# Patient Record
Sex: Female | Born: 1960 | Race: White | Hispanic: No | Marital: Married | Smoking: Former smoker
Health system: Southern US, Community
[De-identification: ages and names within clinical notes are randomized; demographics above are authoritative.]

## PROBLEM LIST (undated history)

## (undated) DIAGNOSIS — R87619 Unspecified abnormal cytological findings in specimens from cervix uteri: Secondary | ICD-10-CM

## (undated) DIAGNOSIS — E079 Disorder of thyroid, unspecified: Secondary | ICD-10-CM

## (undated) DIAGNOSIS — K635 Polyp of colon: Secondary | ICD-10-CM

## (undated) DIAGNOSIS — I872 Venous insufficiency (chronic) (peripheral): Secondary | ICD-10-CM

## (undated) HISTORY — DX: Disorder of thyroid, unspecified: E07.9

## (undated) HISTORY — PX: TOTAL ABDOMINAL HYSTERECTOMY: SHX209

## (undated) HISTORY — DX: Polyp of colon: K63.5

## (undated) HISTORY — PX: ABLATION: SHX5711

## (undated) HISTORY — DX: Unspecified abnormal cytological findings in specimens from cervix uteri: R87.619

## (undated) HISTORY — PX: BREAST EXCISIONAL BIOPSY: SUR124

## (undated) HISTORY — DX: Venous insufficiency (chronic) (peripheral): I87.2

## (undated) HISTORY — PX: ABDOMINAL HYSTERECTOMY: SHX81

---

## 2020-07-29 ENCOUNTER — Telehealth: Payer: Self-pay | Admitting: *Deleted

## 2020-07-29 NOTE — Telephone Encounter (Signed)
Returned call at 1:27 PM from 11:20 AM. Had to put patient on hold while she looked up insurance information for in network providers, patient hung up. Called patient back at 1:43 PM, and left a message to call the office to schedule appointment.

## 2020-08-12 ENCOUNTER — Encounter: Payer: Self-pay | Admitting: Medical-Surgical

## 2020-08-12 ENCOUNTER — Ambulatory Visit (INDEPENDENT_AMBULATORY_CARE_PROVIDER_SITE_OTHER): Payer: BC Managed Care – PPO | Admitting: Medical-Surgical

## 2020-08-12 ENCOUNTER — Other Ambulatory Visit: Payer: Self-pay

## 2020-08-12 ENCOUNTER — Ambulatory Visit (INDEPENDENT_AMBULATORY_CARE_PROVIDER_SITE_OTHER): Payer: BC Managed Care – PPO

## 2020-08-12 VITALS — BP 150/83 | HR 76 | Temp 97.9°F | Ht 66.25 in | Wt 274.5 lb

## 2020-08-12 DIAGNOSIS — Z7689 Persons encountering health services in other specified circumstances: Secondary | ICD-10-CM

## 2020-08-12 DIAGNOSIS — M533 Sacrococcygeal disorders, not elsewhere classified: Secondary | ICD-10-CM | POA: Diagnosis not present

## 2020-08-12 DIAGNOSIS — Z23 Encounter for immunization: Secondary | ICD-10-CM

## 2020-08-12 DIAGNOSIS — N951 Menopausal and female climacteric states: Secondary | ICD-10-CM

## 2020-08-12 DIAGNOSIS — Z1159 Encounter for screening for other viral diseases: Secondary | ICD-10-CM

## 2020-08-12 DIAGNOSIS — Z114 Encounter for screening for human immunodeficiency virus [HIV]: Secondary | ICD-10-CM

## 2020-08-12 DIAGNOSIS — Z1329 Encounter for screening for other suspected endocrine disorder: Secondary | ICD-10-CM

## 2020-08-12 DIAGNOSIS — Z0000 Encounter for general adult medical examination without abnormal findings: Secondary | ICD-10-CM

## 2020-08-12 DIAGNOSIS — Z1231 Encounter for screening mammogram for malignant neoplasm of breast: Secondary | ICD-10-CM

## 2020-08-12 MED ORDER — MELOXICAM 15 MG PO TABS: 15.0000 mg | ORAL_TABLET | Freq: Every day | ORAL | 0 refills | Status: DC

## 2020-08-12 NOTE — Progress Notes (Signed)
New Patient Office Visit  Subjective:  Patient ID: Mariah Jones, female    DOB: [birth year]  Age: 60 y.o. MRN: [id number]  CC:  Chief Complaint  Patient presents with  . Establish Care    HPI Mariah Jones presents to establish care.  Recently relocated with her husband from [place].  Was in [place] around 11 months ago and had a fall where she injured her tailbone.  She did have imaging done that did not see any particular fractures but notes the images were underpenetrated.  Has continued to have pain over the sacrum and coccyx that interferes with all of her regular activities.  Has difficulty sitting still, lying down, exercising, walking, etc. has tried multiple conservative measures including massage therapy, Tylenol, ibuprofen, heat, and ice.  History of GU tumors with total abdominal hysterectomy.  For the past 15 years, she has been experiencing postmenopausal symptoms.  Having hot flashes regularly.  She did attempt hormone replacement but due to her history of tumors, this was not an option for her.  She also did some bioidentical hormone replacement visits but this was not helpful.  Has not tried Effexor or Pristiq.  Blood pressure is elevated today but no significant history of hypertension.  Reports having an abnormal thyroid lab result in the past but is not sure which one.  No personal history of diagnosed thyroid disorder.  History reviewed. No pertinent past medical history.  History reviewed. No pertinent surgical history.  History reviewed. No pertinent family history.  Social History   Socioeconomic History  . Marital status: Not on file    Spouse name: Not on file  . Number of children: Not on file  . Years of education: Not on file  . Highest education level: Not on file  Occupational History  . Not on file  Tobacco Use  . Smoking status: Former Smoker    Quit date: 1987    Years since quitting: 35.3  . Smokeless tobacco: Never Used  Substance and  Sexual Activity  . Alcohol use: Not Currently  . Drug use: Never  . Sexual activity: Yes    Partners: Male    Birth control/protection: Surgical, Post-menopausal  Other Topics Concern  . Not on file  Social History Narrative  . Not on file   Social Determinants of Health   Financial Resource Strain: Not on file  Food Insecurity: Not on file  Transportation Needs: Not on file  Physical Activity: Not on file  Stress: Not on file  Social Connections: Not on file  Intimate Partner Violence: Not on file    ROS Review of Systems  Objective:   Today's Vitals: BP (!) 150/83   Pulse 76   Temp 97.9 F (36.6 C)   Ht 5' 6.25" (1.683 m)   Wt 274 lb 8 oz (124.5 kg)   SpO2 95%   BMI 43.97 kg/m   Physical Exam Vitals reviewed.  Constitutional:      General: She is not in acute distress.    Appearance: Normal appearance. She is obese. She is not ill-appearing.  HENT:     Head: Normocephalic and atraumatic.  Cardiovascular:     Rate and Rhythm: Normal rate and regular rhythm.     Pulses: Normal pulses.     Heart sounds: Normal heart sounds. No murmur heard. No friction rub. No gallop.   Pulmonary:     Effort: Pulmonary effort is normal. No respiratory distress.     Breath sounds: Normal breath sounds. No  wheezing.  Skin:    General: Skin is warm and dry.  Neurological:     Mental Status: She is alert and oriented to person, place, and time.  Psychiatric:        Mood and Affect: Mood normal.        Behavior: Behavior normal.        Thought Content: Thought content normal.        Judgment: Judgment normal.    Assessment & Plan:   1. Encounter to establish care Reviewed available information and discussed healthcare concerns with patient.  2. Encounter for screening mammogram for malignant neoplasm of breast Since she is overdue, ordering mammogram today. - MM 3D SCREEN BREAST BILATERAL; Future  3. Need for Tdap vaccination Tdap given in office today. - Tdap  vaccine greater than or equal to 7yo IM  4. Preventative health care Checking lipid panel today. - Lipid panel  5. Screening for endocrine disorder Checking TSH. - TSH  6. Coccyodynia Getting updated x-rays.  Referring to physical therapy for pelvic PT.  Once x-ray results are available, may need to go step further with advanced imaging.  Start meloxicam 15 mg daily.  Advised against using other ibuprofen products while on this medication.  Because she is allergic to many of the controlled pain medications, list of noncontrolled substances provided for her review on things that may help her pain. - DG Sacrum/Coccyx; Future - Ambulatory referral to Physical Therapy  7.  Menopausal symptoms Although she is not a candidate for hormone replacement, there are other options available.  Discussed Effexor and Pristiq.  She would like to investigate these and will let me know if she would like to try 1.  Outpatient Encounter Medications as of 08/12/2020  Medication Sig  . meloxicam (MOBIC) 15 MG tablet Take 1 tablet (15 mg total) by mouth daily.  . SOOLANTRA 1 % CREA Apply topically.   No facility-administered encounter medications on file as of 08/12/2020.   Follow-up: Return in about 4 weeks (around 09/09/2020) for Coccydynia/menopausal symptoms.   Thayer Ohm, DNP, APRN, FNP-BC [facility]

## 2020-08-12 NOTE — Patient Instructions (Addendum)
Effexor or Pristiq  Body pain: can use Amitriptyline, Gabapentin, Lyrica, or Cymbalta  Tdap (Tetanus, Diphtheria, Pertussis) Vaccine: What You Need to Know 1. Why get vaccinated? Tdap vaccine can prevent tetanus, diphtheria, and pertussis. Diphtheria and pertussis spread from person to person. Tetanus enters the body through cuts or wounds.  TETANUS (T) causes painful stiffening of the muscles. Tetanus can lead to serious health problems, including being unable to open the mouth, having trouble swallowing and breathing, or death.  DIPHTHERIA (D) can lead to difficulty breathing, heart failure, paralysis, or death.  PERTUSSIS (aP), also known as "whooping cough," can cause uncontrollable, violent coughing that makes it hard to breathe, eat, or drink. Pertussis can be extremely serious especially in babies and young children, causing pneumonia, convulsions, brain damage, or death. In teens and adults, it can cause weight loss, loss of bladder control, passing out, and rib fractures from severe coughing. 2. Tdap vaccine Tdap is only for children 7 years and older, adolescents, and adults.  Adolescents should receive a single dose of Tdap, preferably at age 49 or 12 years. Pregnant people should get a dose of Tdap during every pregnancy, preferably during the early part of the third trimester, to help protect the newborn from pertussis. Infants are most at risk for severe, life-threatening complications from pertussis. Adults who have never received Tdap should get a dose of Tdap. Also, adults should receive a booster dose of either Tdap or Td (a different vaccine that protects against tetanus and diphtheria but not pertussis) every 10 years, or after 5 years in the case of a severe or dirty wound or burn. Tdap may be given at the same time as other vaccines. 3. Talk with your health care provider Tell your vaccine provider if the person getting the vaccine:  Has had an allergic reaction after a  previous dose of any vaccine that protects against tetanus, diphtheria, or pertussis, or has any severe, life-threatening allergies  Has had a coma, decreased level of consciousness, or prolonged seizures within 7 days after a previous dose of any pertussis vaccine (DTP, DTaP, or Tdap)  Has seizures or another nervous system problem  Has ever had Guillain-Barr Syndrome (also called "GBS")  Has had severe pain or swelling after a previous dose of any vaccine that protects against tetanus or diphtheria In some cases, your health care provider may decide to postpone Tdap vaccination until a future visit. People with minor illnesses, such as a cold, may be vaccinated. People who are moderately or severely ill should usually wait until they recover before getting Tdap vaccine.  Your health care provider can give you more information. 4. Risks of a vaccine reaction  Pain, redness, or swelling where the shot was given, mild fever, headache, feeling tired, and nausea, vomiting, diarrhea, or stomachache sometimes happen after Tdap vaccination. People sometimes faint after medical procedures, including vaccination. Tell your provider if you feel dizzy or have vision changes or ringing in the ears.  As with any medicine, there is a very remote chance of a vaccine causing a severe allergic reaction, other serious injury, or death. 5. What if there is a serious problem? An allergic reaction could occur after the vaccinated person leaves the clinic. If you see signs of a severe allergic reaction (hives, swelling of the face and throat, difficulty breathing, a fast heartbeat, dizziness, or weakness), call 9-1-1 and get the person to the nearest hospital. For other signs that concern you, call your health care provider.  Adverse  reactions should be reported to the Vaccine Adverse Event Reporting System (VAERS). Your health care provider will usually file this report, or you can do it yourself. Visit the VAERS  website at www.vaers.LAgents.no or call 5706019434. VAERS is only for reporting reactions, and VAERS staff members do not give medical advice. 6. The National Vaccine Injury Compensation Program The [employer] Vaccine Injury Compensation Program (VICP) is a federal program that was created to compensate people who may have been injured by certain vaccines. Claims regarding alleged injury or death due to vaccination have a time limit for filing, which may be as short as two years. Visit the VICP website at SpiritualWord.at or call 4062141260 to learn about the program and about filing a claim. 7. How can I learn more?  Ask your health care provider.  Call your local or state health department.  Visit the website of the Food and Drug Administration (FDA) for vaccine package inserts and additional information at FinderList.no.  Contact the Centers for Disease Control and Prevention (CDC): ? Call (514)149-5494 (1-800-CDC-INFO) or ? Visit CDC's website at PicCapture.uy. Vaccine Information Statement Tdap (Tetanus, Diphtheria, Pertussis) Vaccine (12/01/2019) This information is not intended to replace advice given to you by your health care provider. Make sure you discuss any questions you have with your health care provider. Document Revised: 12/27/2019 Document Reviewed: 12/27/2019 Elsevier Patient Education  2021 [employer].

## 2020-08-13 ENCOUNTER — Encounter: Payer: Self-pay | Admitting: Medical-Surgical

## 2020-08-13 LAB — LIPID PANEL
Cholesterol: 206 mg/dL — ABNORMAL HIGH (ref <200)
HDL: 42 mg/dL — ABNORMAL LOW (ref >=50)
LDL Cholesterol (Calc): 127 mg/dL (calc) — ABNORMAL HIGH
Non-HDL Cholesterol (Calc): 164 mg/dL (calc) — ABNORMAL HIGH (ref <130)
Total CHOL/HDL Ratio: 4.9 (calc) (ref <5.0)
Triglycerides: 231 mg/dL — ABNORMAL HIGH (ref <150)

## 2020-08-13 LAB — TSH: TSH: 2.31 mIU/L (ref 0.40–4.50)

## 2020-08-14 LAB — HM COLONOSCOPY

## 2020-08-19 ENCOUNTER — Ambulatory Visit (INDEPENDENT_AMBULATORY_CARE_PROVIDER_SITE_OTHER): Payer: BC Managed Care – PPO | Admitting: Rehabilitative and Restorative Service Providers"

## 2020-08-19 ENCOUNTER — Other Ambulatory Visit: Payer: Self-pay

## 2020-08-19 ENCOUNTER — Encounter: Payer: Self-pay | Admitting: Rehabilitative and Restorative Service Providers"

## 2020-08-19 DIAGNOSIS — M533 Sacrococcygeal disorders, not elsewhere classified: Secondary | ICD-10-CM

## 2020-08-19 DIAGNOSIS — R29898 Other symptoms and signs involving the musculoskeletal system: Secondary | ICD-10-CM

## 2020-08-19 DIAGNOSIS — R293 Abnormal posture: Secondary | ICD-10-CM

## 2020-08-19 NOTE — Patient Instructions (Addendum)
Trigger Point Dry Needling  . What is Trigger Point Dry Needling (DN)? o DN is a physical therapy technique used to treat muscle pain and dysfunction. Specifically, DN helps deactivate muscle trigger points (muscle knots).  o A thin filiform needle is used to penetrate the skin and stimulate the underlying trigger point. The goal is for a local twitch response (LTR) to occur and for the trigger point to relax. No medication of any kind is injected during the procedure.   . What Does Trigger Point Dry Needling Feel Like?  o The procedure feels different for each individual patient. Some patients report that they do not actually feel the needle enter the skin and overall the process is not painful. Very mild bleeding may occur. However, many patients feel a deep cramping in the muscle in which the needle was inserted. This is the local twitch response.   Marland Kitchen How Will I feel after the treatment? o Soreness is normal, and the onset of soreness may not occur for a few hours. Typically this soreness does not last longer than two days.  o Bruising is uncommon, however; ice can be used to decrease any possible bruising.  o In rare cases feeling tired or nauseous after the treatment is normal. In addition, your symptoms may get worse before they get better, this period will typically not last longer than 24 hours.   . What Can I do After My Treatment? o Increase your hydration by drinking more water for the next 24 hours. o You may place ice or heat on the areas treated that have become sore, however, do not use heat on inflamed or bruised areas. Heat often brings more relief post needling. o You can continue your regular activities, but vigorous activity is not recommended initially after the treatment for 24 hours. o DN is best combined with other physical therapy such as strengthening, stretching, and other therapies.    Stretch for gluteal folds  Myofacial ball release work prone and  standing  Noodle for sitting

## 2020-08-19 NOTE — Therapy (Signed)
[facility] [place] [address] [place], [place], [address] Phone: [phone]   Fax:  [phone]  Physical Therapy Evaluation  Patient Details  Name: Mariah Jones MRN: [id number] Date of Birth: 1960-11-09 Referring Provider (PT): Christen Butter, NP   Encounter Date: 08/19/2020   PT End of Session - 08/19/20 1045    Visit Number 1    Number of Visits 12    Date for PT Re-Evaluation 09/30/20    PT Start Time 0930    PT Stop Time 1035    PT Time Calculation (min) 65 min    Activity Tolerance Patient tolerated treatment well           Past Medical History:  Diagnosis Date  . Abnormal Pap smear of cervix   . Colon polyps   . Thyroid disease   . Venous insufficiency     Past Surgical History:  Procedure Laterality Date  . ABDOMINAL HYSTERECTOMY    . ABLATION      There were no vitals filed for this visit.    Subjective Assessment - 08/19/20 0935    Subjective Patient reports that she was in [place] 5/21 when she fell 3 times in one day. She landed onback. She has had pain in the sacrum, coccyx, Rt LB since that time. She has tried massage therapy and medications with minimal to no improvement. Pain is "nagging" every day.    Patient Stated Goals get rid of pain - pain free    Currently in Pain? Yes    Pain Score 7     Pain Location Coccyx    Pain Orientation Right    Pain Descriptors / Indicators Nagging;Sharp    Pain Type Chronic pain    Pain Onset More than a month ago    Pain Frequency Constant    Aggravating Factors  sitting straight; yard work; dirving; standing;    Pain Relieving Factors sitting leaning forward - has TENS unit with minimal help when on              [facility] PT Assessment - 08/19/20 0001      Assessment   Medical Diagnosis Coccyx pain; Rt LBP    Referring Provider (PT) Christen Butter, NP    Onset Date/Surgical Date 09/14/19    Hand Dominance Left    Next MD Visit 09/09/20    Prior  Therapy massage therapy      Precautions   Precautions None      Restrictions   Weight Bearing Restrictions No      Balance Screen   Has the patient fallen in the past 6 months No    Has the patient had a decrease in activity level because of a fear of falling?  No    Is the patient reluctant to leave their home because of a fear of falling?  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    Leisure household chores; gardening      Observation/Other Assessments   Focus on Therapeutic Outcomes (FOTO)  44      Posture/Postural Control   Posture Comments wt shifted to the Lt Rt hemipelvis slightly higher than Lt      AROM   Lumbar Flexion  70% pulling Rt LB    Lumbar Extension 60%    Lumbar - Right Side Bend 70%    Lumbar - Left Side Bend 70% puling Rt LB    Lumbar - Right Rotation 50% discomfort Rt LB    Lumbar - Left Rotation 60%      Flexibility   Hamstrings tight Rt > Lt    Quadriceps tight Rt > Lt    ITB tight Rt > Lt    Piriformis tight Rt      Palpation   Spinal mobility painful with hypomobile lower lumbar and sacral spine    SI assessment  abnormal alignment    Palpation comment muscular tightness lateral sacral border Rt > Lt into gluteal fold to coccyx; Rt iliopsoas; gluts; piriformis      Special Tests   Other special tests (-) SLR; slump test                      Objective measurements completed on examination: See above findings.       [facility] Adult PT Treatment/Exercise - 08/19/20 0001      Self-Care   Other Self-Care Comments  education re-sitting posture and alignment      Therapeutic Activites    Therapeutic Activities Other Therapeutic Activities    Other Therapeutic Activities myofacial ball release  work prone working through iliopsoas; standing for posterior hip/buttock Rt side      Lumbar Exercises: Stretches   Hip Flexor Stretch Right;2 reps;20 seconds    Hip Flexor Stretch Limitations standing hips back against counter      Lumbar Exercises: Prone   Other Prone Lumbar Exercises stretch for gluteal folds 10 sec hold x 3 to 4 reps      Moist Heat Therapy   Number Minutes Moist Heat 10 Minutes    Moist Heat Location Lumbar Spine      Cryotherapy   Number Minutes Cryotherapy 10 Minutes    Cryotherapy Location Other (comment)   chest to tolerate heat to Rt hip/LB area   Type of Cryotherapy Ice pack      Manual Therapy   Manual therapy comments skilled palpation to assess response to Dn and manual work    Myofascial Release posterior lumbar to Rt posterior hip/buttock    Other Manual Therapy spread of gluteal folds            Trigger Point Dry Needling - 08/19/20 0001    Consent Given? Yes    Education Handout Provided Yes    Dry Needling Comments Rt    Other Dry Needling DN to Rt lumbar along sacral border    Gluteus Medius Response Palpable increased muscle length    Gluteus Maximus Response Palpable increased muscle length    Piriformis Response Palpable increased muscle length                PT Education - 08/19/20 1020    Education Details HEP POC DN myofacial release    Person(s) Educated Patient    Methods Explanation;Demonstration;Tactile cues;Verbal cues;Handout    Comprehension Verbalized understanding;Returned demonstration;Verbal cues required;Tactile cues required               PT Long Term Goals - 08/19/20 1056      PT LONG TERM GOAL #1   Title Decrease pain to 0/10 to 2/10 allowing patient to sit for > 20 min with upright posture    Time 6    Period Weeks  Status New    Target Date 09/30/20      PT LONG TERM GOAL #2   Title Increase lumbar mobility to 80-85% of normal range with no pain    Time 6    Period Weeks    Status New     Target Date 09/30/20      PT LONG TERM GOAL #3   Title Minimal to no tendetness to palpation through the buttocks into the coccyx    Time 6    Period Weeks    Status New    Target Date 09/30/20      PT LONG TERM GOAL #4   Title Independent in HEP    Time 6    Period Weeks    Status New    Target Date 09/30/20      PT LONG TERM GOAL #5   Title Improve functional limitatioin score to 59    Time 6    Period Weeks    Status New                  Plan - 08/19/20 1049    Clinical Impression Statement Patient presents with a hispory of coccyx and Rt LB/hip pain for the past 11 months following a fall 5/21 in which she landed on her back with brief LOC. Patient has had persistent pain in the coccyx and Rt LB with no significant change with medication and massage therapy. She has limited ROM; abnormal posture and alignment; muscular tightness; pain with palpation through gluteal fold to coccyx; limited functional abilities due to pain. Patient will benefit from PT to address problems identified.    Stability/Clinical Decision Making Stable/Uncomplicated    Clinical Decision Making Low    Rehab Potential Good    PT Frequency 2x / week    PT Duration 6 weeks    PT Treatment/Interventions ADLs/Self Care Home Management;Aquatic Therapy;Cryotherapy;Electrical Stimulation;Iontophoresis 4mg /ml Dexamethasone;Moist Heat;Ultrasound;Functional mobility training;Therapeutic activities;Therapeutic exercise;Balance training;Neuromuscular re-education;Patient/family education;Manual techniques;Dry needling    PT Next Visit Plan assess response to initial visit including DN; continue with DN as tolerated; manual work; stretching; strengthening; body mechanics/posture and alignment education; modalities as indicated(pt has TENS unit at home)    Consulted and Agree with Plan of Care Patient           Patient will benefit from skilled therapeutic intervention in order to improve the following  deficits and impairments:  Increased fascial restricitons,Pain,Decreased activity tolerance,Hypomobility,Impaired flexibility,Improper body mechanics,Other (comment),Decreased mobility,Postural dysfunction  Visit Diagnosis: Coccyxdynia  Other symptoms and signs involving the musculoskeletal system  Abnormal posture     Problem List There are no problems to display for this patient.   Horton Ellithorpe PT, MPH 08/19/2020, 11:44 AM  [facility] [address] [place], [place], [place] Phone: [phone]   Fax:  [phone]  Name: Mariah Jones MRN: [name] Date of Birth: 10-25-1960

## 2020-08-21 ENCOUNTER — Other Ambulatory Visit: Payer: Self-pay

## 2020-08-21 ENCOUNTER — Ambulatory Visit (INDEPENDENT_AMBULATORY_CARE_PROVIDER_SITE_OTHER): Payer: BC Managed Care – PPO

## 2020-08-21 DIAGNOSIS — Z1231 Encounter for screening mammogram for malignant neoplasm of breast: Secondary | ICD-10-CM

## 2020-08-22 ENCOUNTER — Encounter: Payer: Self-pay | Admitting: Rehabilitative and Restorative Service Providers"

## 2020-08-22 ENCOUNTER — Ambulatory Visit (INDEPENDENT_AMBULATORY_CARE_PROVIDER_SITE_OTHER): Payer: BC Managed Care – PPO | Admitting: Rehabilitative and Restorative Service Providers"

## 2020-08-22 DIAGNOSIS — M533 Sacrococcygeal disorders, not elsewhere classified: Secondary | ICD-10-CM

## 2020-08-22 DIAGNOSIS — R29898 Other symptoms and signs involving the musculoskeletal system: Secondary | ICD-10-CM | POA: Diagnosis not present

## 2020-08-22 DIAGNOSIS — R293 Abnormal posture: Secondary | ICD-10-CM

## 2020-08-22 NOTE — Patient Instructions (Signed)
Access Code: [email or web address]: [email or web address]: 04/28/2022Prepared by: Shalamar Crays HoltExercises  Supine Piriformis Stretch with Leg Straight - 2 x daily - 7 x weekly - 1 sets - 3 reps - 30 sec hold  Supine Transversus Abdominis Bracing with Pelvic Floor Contraction - 2 x daily - 7 x weekly - 1 sets - 10 reps - 10sec hold  Sit to Stand - 2 x daily - 7 x weekly - 1 sets - 10 reps - 3-5 sec hold Patient Education  Hospital doctor

## 2020-08-22 NOTE — Therapy (Signed)
[facility] [place] [address] [place], [place], [address] Phone: [phone]   Fax:  [phone]  Physical Therapy Treatment  Patient Details  Name: Mariah Jones MRN: [id number] Date of Birth: Aug 09, 1960 Referring Provider (PT): Christen Butter, NP   Encounter Date: 08/22/2020   PT End of Session - 08/22/20 1615    Visit Number 2    Number of Visits 12    Date for PT Re-Evaluation 09/30/20    PT Start Time 1614    PT Stop Time 1705   MH end of treatment   PT Time Calculation (min) 51 min    Activity Tolerance Patient tolerated treatment well           Past Medical History:  Diagnosis Date  . Abnormal Pap smear of cervix   . Colon polyps   . Thyroid disease   . Venous insufficiency     Past Surgical History:  Procedure Laterality Date  . ABDOMINAL HYSTERECTOMY    . ABLATION    . BREAST EXCISIONAL BIOPSY Left    Age 60    There were no vitals filed for this visit.   Subjective Assessment - 08/22/20 1618    Subjective Felt much better following the first treatment. Has a ball and is working with the self massage. Encouraged by initial progress.    Currently in Pain? Yes    Pain Score 4     Pain Location Coccyx    Pain Orientation Right    Pain Descriptors / Indicators Nagging              [facility] PT Assessment - 08/22/20 0001      Assessment   Medical Diagnosis Coccyx pain; Rt LBP    Referring Provider (PT) Christen Butter, NP    Onset Date/Surgical Date 09/14/19    Hand Dominance Left    Next MD Visit 09/09/20    Prior Therapy massage therapy      Posture/Postural Control   Posture Comments improved standing and sitting posture with decresaed wt shift to the Lt more equal alignment/wt bearing      Palpation   Palpation comment decreased muscular tightness lateral sacral border Rt > Lt into gluteal fold to coccyx; Rt iliopsoas; gluts; piriformis                         [facility] Adult PT  Treatment/Exercise - 08/22/20 0001      Self-Care   Other Self-Care Comments  education re-back care and body mechanics      Therapeutic Activites    Therapeutic Activities Other Therapeutic Activities    Other Therapeutic Activities myofacial ball release work prone working through iliopsoas; standing for posterior hip/buttock Rt side; trial of sitting on soft ball 1-2 min      Lumbar Exercises: Stretches   Hip Flexor Stretch Right;2 reps;20 seconds    Hip Flexor Stretch Limitations standing hips back against counter    Piriformis Stretch Right;3 reps;30 seconds   supine travell with strap     Lumbar Exercises: Seated   Sit to Stand 10 reps   verbal cues to hinge hips and engage core     Lumbar Exercises: Supine   Other Supine Lumbar Exercises 3 and 4 part core 10 sec hold x 10 reps      Lumbar Exercises: Prone   Other Prone Lumbar Exercises stretch for gluteal folds 10 sec hold x 3 to 4 reps  Moist Heat Therapy   Number Minutes Moist Heat 10 Minutes    Moist Heat Location Lumbar Spine      Manual Therapy   Manual therapy comments skilled palpation to assess response to Dn and manual work    Soft tissue mobilization deep tissue work to pt tolerance through the Rt posterior hip and gluteal fold toward coccyx    Myofascial Release posterior lumbar to Rt posterior hip/buttock    Other Manual Therapy spread of gluteal folds            Trigger Point Dry Needling - 08/22/20 0001    Consent Given? Yes    Education Handout Provided Previously provided    Dry Needling Comments Rt    Gluteus Medius Response Palpable increased muscle length    Gluteus Maximus Response Palpable increased muscle length    Piriformis Response Palpable increased muscle length                PT Education - 08/22/20 1649    Education Details HEP back care    Person(s) Educated Patient    Methods Explanation;Demonstration;Tactile cues;Verbal cues;Handout    Comprehension Verbalized  understanding;Returned demonstration;Verbal cues required;Tactile cues required               PT Long Term Goals - 08/19/20 1056      PT LONG TERM GOAL #1   Title Decrease pain to 0/10 to 2/10 allowing patient to sit for > 20 min with upright posture    Time 6    Period Weeks    Status New    Target Date 09/30/20      PT LONG TERM GOAL #2   Title Increase lumbar mobility to 80-85% of normal range with no pain    Time 6    Period Weeks    Status New    Target Date 09/30/20      PT LONG TERM GOAL #3   Title Minimal to no tendetness to palpation through the buttocks into the coccyx    Time 6    Period Weeks    Status New    Target Date 09/30/20      PT LONG TERM GOAL #4   Title Independent in HEP    Time 6    Period Weeks    Status New    Target Date 09/30/20      PT LONG TERM GOAL #5   Title Improve functional limitatioin score to 59    Time 6    Period Weeks    Status New                 Plan - 08/22/20 1638    Clinical Impression Statement Good response to initial treatment and HEP. Patient reports less pain. She is working on her posture and alignment as well as ball relesae work and exercises. Added exercises for home. Continued with DN and manual work with patient more tolerant of manual work, less sensative. Note improved movement patterns and gait pattern. Excellent progress.    Rehab Potential Good    PT Frequency 2x / week    PT Duration 6 weeks    PT Treatment/Interventions ADLs/Self Care Home Management;Aquatic Therapy;Cryotherapy;Electrical Stimulation;Iontophoresis 4mg /ml Dexamethasone;Moist Heat;Ultrasound;Functional mobility training;Therapeutic activities;Therapeutic exercise;Balance training;Neuromuscular re-education;Patient/family education;Manual techniques;Dry needling    PT Next Visit Plan continue with DN as tolerated; manual work; stretching; strengthening; body mechanics/posture and alignment education; modalities as indicated(pt has  TENS unit at home)    PT Home Exercise  Plan [email or web address]    Consulted and Agree with Plan of Care Patient           Patient will benefit from skilled therapeutic intervention in order to improve the following deficits and impairments:     Visit Diagnosis: Coccyxdynia  Other symptoms and signs involving the musculoskeletal system  Abnormal posture     Problem List There are no problems to display for this patient.   Johnothan Bascomb Rober Minion PT, MPH  08/22/2020, 5:01 PM  [facility] [address] [place], [place], [address] Phone: [phone]   Fax:  [phone]  Name: Mariah Jones MRN: [id number] Date of Birth: 22-Jan-1961

## 2020-09-02 ENCOUNTER — Encounter: Payer: BC Managed Care – PPO | Admitting: Rehabilitative and Restorative Service Providers"

## 2020-09-05 ENCOUNTER — Encounter: Payer: BC Managed Care – PPO | Admitting: Rehabilitative and Restorative Service Providers"

## 2020-09-08 ENCOUNTER — Other Ambulatory Visit: Payer: Self-pay | Admitting: Medical-Surgical

## 2020-09-09 ENCOUNTER — Telehealth: Payer: Self-pay | Admitting: Medical-Surgical

## 2020-09-09 ENCOUNTER — Ambulatory Visit: Payer: BC Managed Care – PPO | Admitting: Medical-Surgical

## 2020-09-09 DIAGNOSIS — Z5329 Procedure and treatment not carried out because of patient's decision for other reasons: Secondary | ICD-10-CM

## 2020-09-09 NOTE — Telephone Encounter (Signed)
Thank you for letting me know. Please have her reschedule her appointment.  Thanks, Ander Slade

## 2020-09-09 NOTE — Telephone Encounter (Signed)
Pt called. She thought her appt was at 2:20.  Thank you.

## 2020-09-09 NOTE — Progress Notes (Signed)
   Complete physical exam  Patient: Mariah Jones   DOB: 02/14/1999   60 y.o. Female  MRN: [id number]  Subjective:    No chief complaint on file.   Mariah Jones is a 60 y.o. female who presents today for a complete physical exam. She reports consuming a {diet types:17450} diet. {types:19826} She generally feels {DESC; WELL/FAIRLY WELL/POORLY:18703}. She reports sleeping {DESC; WELL/FAIRLY WELL/POORLY:18703}. She {does/does not:200015} have additional problems to discuss today.    Most recent fall risk assessment:    10/22/2021   10:42 AM  Fall Risk   Falls in the past year? 0  Number falls in past yr: 0  Injury with Fall? 0  Risk for fall due to : No Fall Risks  Follow up Falls evaluation completed     Most recent depression screenings:    10/22/2021   10:42 AM 09/12/2020   10:46 AM  PHQ 2/9 Scores  PHQ - 2 Score 0 0  PHQ- 9 Score 5     {VISON DENTAL STD PSA (Optional):27386}  {History (Optional):23778}  Patient Care Team: Letia Guidry, NP as PCP - General (Nurse Practitioner)   Outpatient Medications Prior to Visit  Medication Sig   fluticasone (FLONASE) 50 MCG/ACT nasal spray Place 2 sprays into both nostrils in the morning and at bedtime. After 7 days, reduce to once daily.   norgestimate-ethinyl estradiol (SPRINTEC 28) 0.25-35 MG-MCG tablet Take 1 tablet by mouth daily.   Nystatin POWD Apply liberally to affected area 2 times per day   spironolactone (ALDACTONE) 100 MG tablet Take 1 tablet (100 mg total) by mouth daily.   No facility-administered medications prior to visit.    ROS        Objective:     There were no vitals taken for this visit. {Vitals History (Optional):23777}  Physical Exam   No results found for any visits on 11/27/21. {Show previous labs (optional):23779}    Assessment & Plan:    Routine Health Maintenance and Physical Exam  Immunization History  Administered Date(s) Administered   DTaP 04/30/1999, 06/26/1999,  09/04/1999, 05/20/2000, 12/04/2003   Hepatitis A 09/30/2007, 10/05/2008   Hepatitis B 02/15/1999, 03/25/1999, 09/04/1999   HiB (PRP-OMP) 04/30/1999, 06/26/1999, 09/04/1999, 05/20/2000   IPV 04/30/1999, 06/26/1999, 02/23/2000, 12/04/2003   Influenza,inj,Quad PF,6+ Mos 01/05/2014   Influenza-Unspecified 04/06/2012   MMR 02/22/2001, 12/04/2003   Meningococcal Polysaccharide 10/05/2011   Pneumococcal Conjugate-13 05/20/2000   Pneumococcal-Unspecified 09/04/1999, 11/18/1999   Tdap 10/05/2011   Varicella 02/23/2000, 09/30/2007    Health Maintenance  Topic Date Due   HIV Screening  Never done   Hepatitis C Screening  Never done   INFLUENZA VACCINE  11/25/2021   PAP-Cervical Cytology Screening  11/27/2021 (Originally 02/14/2020)   PAP SMEAR-Modifier  11/27/2021 (Originally 02/14/2020)   TETANUS/TDAP  11/27/2021 (Originally 10/04/2021)   HPV VACCINES  Discontinued   COVID-19 Vaccine  Discontinued    Discussed health benefits of physical activity, and encouraged her to engage in regular exercise appropriate for her age and condition.  Problem List Items Addressed This Visit   None Visit Diagnoses     Annual physical exam    -  Primary   Cervical cancer screening       Need for Tdap vaccination          No follow-ups on file.     Corrion Stirewalt, NP   

## 2020-09-12 ENCOUNTER — Encounter: Payer: Self-pay | Admitting: Rehabilitative and Restorative Service Providers"

## 2020-09-12 ENCOUNTER — Ambulatory Visit (INDEPENDENT_AMBULATORY_CARE_PROVIDER_SITE_OTHER): Payer: BC Managed Care – PPO | Admitting: Rehabilitative and Restorative Service Providers"

## 2020-09-12 ENCOUNTER — Other Ambulatory Visit: Payer: Self-pay

## 2020-09-12 DIAGNOSIS — R293 Abnormal posture: Secondary | ICD-10-CM

## 2020-09-12 DIAGNOSIS — M533 Sacrococcygeal disorders, not elsewhere classified: Secondary | ICD-10-CM | POA: Diagnosis not present

## 2020-09-12 DIAGNOSIS — R29898 Other symptoms and signs involving the musculoskeletal system: Secondary | ICD-10-CM

## 2020-09-12 NOTE — Patient Instructions (Signed)
Access Code: [email or web address]: [email or web address]: 05/19/2022Prepared by: Majid Mccravy HoltExercises  Supine Piriformis Stretch with Leg Straight - 2 x daily - 7 x weekly - 1 sets - 3 reps - 30 sec hold  Supine Transversus Abdominis Bracing with Pelvic Floor Contraction - 2 x daily - 7 x weekly - 1 sets - 10 reps - 10sec hold  Sit to Stand - 2 x daily - 7 x weekly - 1 sets - 10 reps - 3-5 sec hold  Cat-Camel - 2 x daily - 7 x weekly - 1 sets - 5-10 reps - 3-5 sec hold  Cat-Camel to Child's Pose - 2 x daily - 7 x weekly - 1 sets - 3-5 reps - 20-30 sec hold  Supine Piriformis Stretch - 2 x daily - 7 x weekly - 1 sets - 3 reps - 30 sec hold  Hooklying Isometric Clamshell - 2 x daily - 7 x weekly - 1 sets - 10 reps - 3 sec hold

## 2020-09-12 NOTE — Therapy (Signed)
[facility] [place] [address] [place], [place], [address] Phone: [phone]   Fax:  [phone]  Physical Therapy Treatment  Patient Details  Name: Mariah Jones MRN: [id number] Date of Birth: 29-Jul-1960 Referring Provider (PT): Christen Butter, NP   Encounter Date: 09/12/2020   PT End of Session - 09/12/20 0801    Visit Number 3    Number of Visits 12    Date for PT Re-Evaluation 09/30/20    PT Start Time 0801    PT Stop Time 0849    PT Time Calculation (min) 48 min    Activity Tolerance Patient tolerated treatment well           Past Medical History:  Diagnosis Date  . Abnormal Pap smear of cervix   . Colon polyps   . Thyroid disease   . Venous insufficiency     Past Surgical History:  Procedure Laterality Date  . ABDOMINAL HYSTERECTOMY    . ABLATION    . BREAST EXCISIONAL BIOPSY Left    Age 7    There were no vitals filed for this visit.   Subjective Assessment - 09/12/20 0802    Subjective Patient reports that she loves the ball and can tell a difference when she uses the ball. She has good days and bad days. Still has pain in the Rt posterior hip. She still sometimes "off loads" the Rt hip when sitting.    Currently in Pain? Yes    Pain Score 2     Pain Location Coccyx    Pain Orientation Right    Pain Descriptors / Indicators Nagging    Pain Type Chronic pain    Pain Onset More than a month ago    Pain Frequency Intermittent              [facility] PT Assessment - 09/12/20 0001      Assessment   Medical Diagnosis Coccyx pain; Rt LBP    Referring Provider (PT) Christen Butter, NP    Onset Date/Surgical Date 09/14/19    Hand Dominance Left    Next MD Visit to schedule    Prior Therapy massage therapy      AROM   Lumbar Flexion 75% pulling Rt LB    Lumbar Extension 65%    Lumbar - Right Side Bend 80%    Lumbar - Left Side Bend 80%    Lumbar - Right Rotation 50% discomfort Rt LB    Lumbar - Left  Rotation 60%      Flexibility   Soft Tissue Assessment /Muscle Length --   tight psoas Rt > Lt   Quadriceps tight Rt > Lt    ITB tight Rt > Lt    Piriformis tight Rt > Lt      Palpation   Palpation comment muscular tightness lateral sacral border Rt > Lt into gluteal fold to coccyx; Rt iliopsoas; gluts; piriformis                         [facility] Adult PT Treatment/Exercise - 09/12/20 0001      Lumbar Exercises: Stretches   Piriformis Stretch Right;3 reps;30 seconds   supine travell with strap   Figure 4 Stretch 3 reps;30 seconds   knees to chest     Lumbar Exercises: Seated   Sit to Stand 10 reps   verbal cues to hinge hips and engage core     Lumbar Exercises: Supine  Clam 10 reps;3 seconds   gneen TB - issued blue TB for home   Other Supine Lumbar Exercises 3 and 4 part core 10 sec hold x 10 reps      Lumbar Exercises: Quadruped   Madcat/Old Horse 5 reps    Madcat/Old Horse Limitations childs pose x 5 x 10-15 sec hold      Moist Heat Therapy   Number Minutes Moist Heat 10 Minutes    Moist Heat Location Lumbar Spine      Manual Therapy   Manual therapy comments skilled palpation to assess response to Dn and manual work    Soft tissue mobilization deep tissue work to pt tolerance through the Rt posterior hip and gluteal fold toward coccyx    Myofascial Release posterior lumbar to Rt posterior hip/buttock    Other Manual Therapy spread of gluteal folds            Trigger Point Dry Needling - 09/12/20 0001    Consent Given? Yes    Education Handout Provided Previously provided    Dry Needling Comments Rt/Lt    Electrical Stimulation Performed with Dry Needling Yes    Gluteus Minimus Response Palpable increased muscle length   RT/LT   Gluteus Maximus Response Palpable increased muscle length    Piriformis Response Palpable increased muscle length                PT Education - 09/12/20 0840    Education Details HEP    Person(s) Educated Patient     Methods Explanation;Demonstration;Tactile cues;Verbal cues;Handout    Comprehension Verbalized understanding;Returned demonstration;Verbal cues required;Tactile cues required               PT Long Term Goals - 08/19/20 1056      PT LONG TERM GOAL #1   Title Decrease pain to 0/10 to 2/10 allowing patient to sit for > 20 min with upright posture    Time 6    Period Weeks    Status New    Target Date 09/30/20      PT LONG TERM GOAL #2   Title Increase lumbar mobility to 80-85% of normal range with no pain    Time 6    Period Weeks    Status New    Target Date 09/30/20      PT LONG TERM GOAL #3   Title Minimal to no tendetness to palpation through the buttocks into the coccyx    Time 6    Period Weeks    Status New    Target Date 09/30/20      PT LONG TERM GOAL #4   Title Independent in HEP    Time 6    Period Weeks    Status New    Target Date 09/30/20      PT LONG TERM GOAL #5   Title Improve functional limitatioin score to 59    Time 6    Period Weeks    Status New                 Plan - 09/12/20 1700    Clinical Impression Statement Patient reports some improvement but continued pain in the Rt posterior hip and along crest of the pelvis. She demonstrates some improvement in trunk mobility/ROM and has less palpable tightness in the posterior hip to coccyx. She is stretching only one time a day at most. Encouraged pt to increase exercises to at least two times a day and avoid externally  rotated positions in LE's. Added Cat camel and child's pose. Continued with other exercises. Patient needs to be more consistent with HEP    Rehab Potential Good    PT Frequency 2x / week    PT Duration 6 weeks    PT Treatment/Interventions ADLs/Self Care Home Management;Aquatic Therapy;Cryotherapy;Electrical Stimulation;Iontophoresis 4mg /ml Dexamethasone;Moist Heat;Ultrasound;Functional mobility training;Therapeutic activities;Therapeutic exercise;Balance  training;Neuromuscular re-education;Patient/family education;Manual techniques;Dry needling    PT Next Visit Plan continue with DN as tolerated; manual work; stretching; strengthening; body mechanics/posture and alignment education; modalities as indicated(pt has TENS unit at home)    PT Home Exercise Plan    Consulted and Agree with Plan of Care Patient           Patient will benefit from skilled therapeutic intervention in order to improve the following deficits and impairments:     Visit Diagnosis: Coccyxdynia  Other symptoms and signs involving the musculoskeletal system  Abnormal posture     Problem List There are no problems to display for this patient.   Milan Perkins [id number] PT, MPH  09/12/2020, 8:46 AM  [facility] [address] [place], [place], [place] Phone: [phone]   Fax:  [phone]  Name: Mariah Jones MRN: [name] Date of Birth: 02/04/1961

## 2020-09-12 NOTE — Telephone Encounter (Signed)
You're welcome. She has an appt coming up May 31st.

## 2020-09-16 ENCOUNTER — Ambulatory Visit (INDEPENDENT_AMBULATORY_CARE_PROVIDER_SITE_OTHER): Payer: BC Managed Care – PPO | Admitting: Rehabilitative and Restorative Service Providers"

## 2020-09-16 ENCOUNTER — Encounter: Payer: Self-pay | Admitting: Rehabilitative and Restorative Service Providers"

## 2020-09-16 ENCOUNTER — Other Ambulatory Visit: Payer: Self-pay

## 2020-09-16 DIAGNOSIS — R293 Abnormal posture: Secondary | ICD-10-CM

## 2020-09-16 DIAGNOSIS — M533 Sacrococcygeal disorders, not elsewhere classified: Secondary | ICD-10-CM

## 2020-09-16 DIAGNOSIS — R29898 Other symptoms and signs involving the musculoskeletal system: Secondary | ICD-10-CM | POA: Diagnosis not present

## 2020-09-16 NOTE — Therapy (Signed)
[facility] [place] [address] [place], [place], [address] Phone: [phone]   Fax:  [phone]  Physical Therapy Treatment  Patient Details  Name: Mariah Jones MRN: [id number] Date of Birth: 23-Jul-1960 Referring Provider (PT): Christen Butter, NP   Encounter Date: 09/16/2020   PT End of Session - 09/16/20 0931    Visit Number 4    Number of Visits 12    Date for PT Re-Evaluation 09/30/20    PT Start Time 0930    PT Stop Time 1018    PT Time Calculation (min) 48 min    Activity Tolerance Patient tolerated treatment well           Past Medical History:  Diagnosis Date  . Abnormal Pap smear of cervix   . Colon polyps   . Thyroid disease   . Venous insufficiency     Past Surgical History:  Procedure Laterality Date  . ABDOMINAL HYSTERECTOMY    . ABLATION    . BREAST EXCISIONAL BIOPSY Left    Age 60    There were no vitals filed for this visit.   Subjective Assessment - 09/16/20 0932    Subjective Patient reports that she felt the area we needled last week felt like it was "on fire". She has increased pain in the hip and buttock area that feels like it did when she first came in. She is working again and sitting more and going up and down the steps. She is using the TENS unit at home.    Currently in Pain? Yes    Pain Score 6     Pain Location Coccyx    Pain Orientation Right    Pain Descriptors / Indicators Nagging    Pain Type Chronic pain              [facility] PT Assessment - 09/16/20 0001      Assessment   Medical Diagnosis Coccyx pain; Rt LBP    Referring Provider (PT) Christen Butter, NP    Onset Date/Surgical Date 09/14/19    Hand Dominance Left    Next MD Visit to schedule    Prior Therapy massage therapy      Precautions   Precautions None      Palpation   Palpation comment muscular tightness lateral sacral border Rt > Lt into gluteal fold to coccyx; Rt iliopsoas; gluts; piriformis                          [facility] Adult PT Treatment/Exercise - 09/16/20 0001      Therapeutic Activites    Other Therapeutic Activities sitting on ball btn coccyx and ischial tuberosity; myofacial ball release work prone working through iliopsoas; standing for posterior hip/buttock Rt side      Lumbar Exercises: Seated   Sit to Stand 10 reps   verbal cues to hinge hips and engage core     Lumbar Exercises: Supine   Clam 10 reps;3 seconds   gneen TB - issued blue TB for home   Other Supine Lumbar Exercises 3 and 4 part core 10 sec hold x 10 reps      Lumbar Exercises: Quadruped   Madcat/Old Horse 5 reps    Madcat/Old Horse Limitations childs pose x 3 x 10-15 sec hold    Other Quadruped Lumbar Exercises quadruped hips IR rocking back and forth; side to side; diagonals      Moist Heat Therapy   Number  Minutes Moist Heat 10 Minutes    Moist Heat Location Lumbar Spine      Manual Therapy   Manual therapy comments pt in quadruped amd prone    Soft tissue mobilization deep tissue work to pt tolerance through the Rt posterior hip and gluteal fold toward coccyx    Myofascial Release posterior lumbar to Rt posterior hip/buttock    Other Manual Therapy spread of gluteal folds                       PT Long Term Goals - 08/19/20 1056      PT LONG TERM GOAL #1   Title Decrease pain to 0/10 to 2/10 allowing patient to sit for > 20 min with upright posture    Time 6    Period Weeks    Status New    Target Date 09/30/20      PT LONG TERM GOAL #2   Title Increase lumbar mobility to 80-85% of normal range with no pain    Time 6    Period Weeks    Status New    Target Date 09/30/20      PT LONG TERM GOAL #3   Title Minimal to no tendetness to palpation through the buttocks into the coccyx    Time 6    Period Weeks    Status New    Target Date 09/30/20      PT LONG TERM GOAL #4   Title Independent in HEP    Time 6    Period Weeks    Status New    Target Date  09/30/20      PT LONG TERM GOAL #5   Title Improve functional limitatioin score to 59    Time 6    Period Weeks    Status New                 Plan - 09/16/20 1010    Clinical Impression Statement Patient reports continued "burning' sensation following last visit with DN. Hold DN for now. Continued exercises adding quadruped w/hips Rt rocking. Manual work through the Rt > Lt posterior hips/buttocks into the coccyx area which remains tighter Rt than Lt. Less palpable tightness Rt than previous visits. Gait improving with minimal limp after she is walking for ~ 10-20 feet.    Rehab Potential Good    PT Frequency 2x / week    PT Duration 6 weeks    PT Treatment/Interventions ADLs/Self Care Home Management;Aquatic Therapy;Cryotherapy;Electrical Stimulation;Iontophoresis 4mg /ml Dexamethasone;Moist Heat;Ultrasound;Functional mobility training;Therapeutic activities;Therapeutic exercise;Balance training;Neuromuscular re-education;Patient/family education;Manual techniques;Dry needling    PT Next Visit Plan hold DN; continue with manual work; stretching; strengthening; body mechanics/posture and alignment education; modalities as indicated(pt has TENS unit at home)    PT Home Exercise Plan    Consulted and Agree with Plan of Care Patient           Patient will benefit from skilled therapeutic intervention in order to improve the following deficits and impairments:     Visit Diagnosis: Coccyxdynia  Other symptoms and signs involving the musculoskeletal system  Abnormal posture     Problem List There are no problems to display for this patient.   Petr Bontempo [id number] PT, MPH  09/16/2020, 10:14 AM  [facility] [address] [place], [place], [place] Phone: [phone]   Fax:  [phone]  Name: Mariah Jones MRN: [name] Date of Birth: 07-May-1960

## 2020-09-18 ENCOUNTER — Encounter: Payer: BC Managed Care – PPO | Admitting: Rehabilitative and Restorative Service Providers"

## 2020-09-19 ENCOUNTER — Ambulatory Visit: Payer: BC Managed Care – PPO | Attending: Medical-Surgical | Admitting: Physical Therapy

## 2020-09-19 ENCOUNTER — Other Ambulatory Visit: Payer: Self-pay

## 2020-09-19 ENCOUNTER — Encounter: Payer: Self-pay | Admitting: Physical Therapy

## 2020-09-19 DIAGNOSIS — M6281 Muscle weakness (generalized): Secondary | ICD-10-CM | POA: Diagnosis present

## 2020-09-19 DIAGNOSIS — R29898 Other symptoms and signs involving the musculoskeletal system: Secondary | ICD-10-CM | POA: Diagnosis present

## 2020-09-19 DIAGNOSIS — M62838 Other muscle spasm: Secondary | ICD-10-CM | POA: Insufficient documentation

## 2020-09-19 DIAGNOSIS — R293 Abnormal posture: Secondary | ICD-10-CM | POA: Insufficient documentation

## 2020-09-19 DIAGNOSIS — M533 Sacrococcygeal disorders, not elsewhere classified: Secondary | ICD-10-CM | POA: Diagnosis not present

## 2020-09-19 NOTE — Therapy (Signed)
[facility] [address], [address] [place], [place], [address] Phone: [phone]   Fax:  [phone]  Physical Therapy Treatment  Patient Details  Name: Mariah Jones MRN: [id number] Date of Birth: 21-Mar-1961 Referring Provider (PT): Christen Butter, NP   Encounter Date: 09/19/2020   PT End of Session - 09/19/20 1043    Visit Number 5    Date for PT Re-Evaluation 12/23/20    Authorization Type BCBS    Authorization - Visit Number 5    Authorization - Number of Visits 30    PT Start Time 0930    PT Stop Time 1030    PT Time Calculation (min) 60 min    Activity Tolerance Patient tolerated treatment well;No increased pain    Behavior During Therapy WFL for tasks assessed/performed           Past Medical History:  Diagnosis Date  . Abnormal Pap smear of cervix   . Colon polyps   . Thyroid disease   . Venous insufficiency     Past Surgical History:  Procedure Laterality Date  . ABDOMINAL HYSTERECTOMY    . ABLATION    . BREAST EXCISIONAL BIOPSY Left    Age 60    There were no vitals filed for this visit.   Subjective Assessment - 09/19/20 0933    Subjective Each dry needling session had increased pain. No difficulty with bowel movements. No urinary issues. /ruptured atopic pregnancy and hysterectomy, 15 years ago. Fallen on her buttocks 3 times on the same day. She fell 1 year ago. I see the nurse pratitiioner on Tuesday. Coccyx pain better by 50% from the initial eval.    Patient Stated Goals get rid of pain - pain free    Currently in Pain? Yes    Pain Score 6     Pain Location Coccyx    Pain Orientation Mid;Right    Pain Descriptors / Indicators Sharp;Other (Comment)   deep   Pain Type Chronic pain    Pain Onset More than a month ago    Pain Frequency Intermittent    Aggravating Factors  sitting in seat of car and hard padded chairs, yardwork, sleeping on left side causes throbbing on left leg    Pain  Relieving Factors sitting and leaning to left    Multiple Pain Sites Yes    Pain Score 7    Pain Location Sacrum    Pain Orientation Mid    Pain Descriptors / Indicators Constant    Pain Type Chronic pain    Pain Radiating Towards radiates from the sacrum to the  gluteus medius    Pain Onset More than a month ago    Pain Frequency Constant    Aggravating Factors  standing too long, driving in the car, being in the bed    Pain Relieving Factors uses a ball to massage and hurst so good;              [facility] PT Assessment - 09/19/20 0001      Assessment   Medical Diagnosis Coccyx pain; Rt LBP    Referring Provider (PT) Christen Butter, NP    Onset Date/Surgical Date 09/14/19    Hand Dominance Left    Next MD Visit to schedule    Prior Therapy massage therapy      Precautions   Precautions None      Restrictions   Weight Bearing Restrictions No      Balance Screen  Has the patient fallen in the past 6 months No    Has the patient had a decrease in activity level because of a fear of falling?  No    Is the patient reluctant to leave their home because of a fear of falling?  No      Prior Function   Level of Independence Independent    Vocation Full time employment    Vocation Requirements sitting with a king size feather pillow for extra support    Leisure work, Sales promotion account executive   Overall Cognitive Status Within Functional Limits for tasks assessed      Posture/Postural Control   Posture/Postural Control No significant limitations      ROM / Strength   AROM / PROM / Strength AROM;PROM;Strength      AROM   Right Hip Extension 5    Left Hip Extension 0    Lumbar Flexion decreased by 25% with tightness in the lubar; right PSIS moves upward and higher but both starts out equal    Lumbar Extension decreased by 25%    Lumbar - Right Side Bend no C curve, decreased movement by 50%    Lumbar - Left Side Bend no c curve, decreased by 50%    Lumbar - Right Rotation  decreased by 50%    Lumbar - Left Rotation full      PROM   Overall PROM Comments hip adduction limited bilaterally    Right Hip External Rotation  45    Left Hip External Rotation  70      Strength   Right Hip ABduction 4/5    Right Hip ADduction 4/5    Left Hip ABduction 3+/5    Left Hip ADduction 4/5      Palpation   SI assessment  left AIS is posteriorly rotated; sacrum rotated right    Palpation comment tenderness located in right lower quadrant, thickness in the right gluteal, sacrum very tender      Special Tests    Special Tests Sacrolliac Tests    Other special tests single leg stance very shaky in the pelvis and 1-2 seconds    Sacroiliac Tests  Pelvic Compression      Pelvic Compression   Findings Positive    Side Left    comment pain                         [facility] Adult PT Treatment/Exercise - 09/19/20 0001      Manual Therapy   Manual Therapy Joint mobilization;Soft tissue mobilization;Myofascial release    Joint Mobilization PA and rotational mobilization of T9-L5; sacral mobilization to correct right rotation    Soft tissue mobilization deep soft tissue work to the thoracic and lumbar paraspinals, gluteals, SI joint, quadratus, along the posterior lower rib cage, levator ani    Myofascial Release using suction cups to pull up the fascial from the skin along the gluteals, thoracic and lumbar areas                       PT Long Term Goals - 09/19/20 1054      PT LONG TERM GOAL #1   Title Decrease pain to 0/10 to 2/10 allowing patient to sit for > 20 min with upright posture    Time 6    Period Weeks    Status On-going      PT LONG TERM GOAL #2  Title Increase lumbar mobility to 80-85% of normal range with no pain to perfrom daily tasks    Time 6    Period Weeks    Status Revised      PT LONG TERM GOAL #3   Title Minimal to no tendetness to palpation through the buttocks into the coccyx making it easier for her to sit and  drive and due to pelvis and coccyc  in correct alignment    Time 6    Period Weeks    Status Revised      PT LONG TERM GOAL #4   Title Independent in HEP for core and hip strength to improve function    Time 6    Period Weeks    Status Revised      PT LONG TERM GOAL #5   Title Improve functional limitatioin score to 59    Time 6    Period Weeks    Status On-going                 Plan - 09/19/20 1046    Clinical Impression Statement Patient has  constant pain in the sacral area and gluteus medius at level at level 7/10 and coccyx pain is 6/10. Her lumbar ROM is limited by 50% with very little lumbar spine movement. Her left ilium is rotated posteriorly with positive compression test on the left. The sacrum is rotated to the right with increased tenderness located on the right SI joint. The coccyx is sidebend to the right with increased pain in the right levator ani and coccygeus. Decreased movement of T8-L5 and decreased lower rib cage movement. She has weakness in left hip and decreased left hip extension and bilateral hip abduction. Her left hip external rotation is limited compared to the right hip external rotation. Stand on one leg with incresaed sway of the trunk and pelvis for 1-2 seconds on each leg.  After manual work her pain was not constant and she was able to stand taller. Patient will benefit from skilled therapy to improve tissue mobility and strength to reduce pain and improve quality of life.    Personal Factors and Comorbidities Fitness;Time since onset of injury/illness/exacerbation;Comorbidity 1    Comorbidities abdominal hysterectomy    Examination-Activity Limitations Sit;Locomotion Level;Bed Mobility;Stand    Examination-Participation Restrictions Community Activity;Driving;Occupation    Stability/Clinical Decision Making Stable/Uncomplicated    Clinical Decision Making Low    Rehab Potential Excellent    PT Frequency 1x / week    PT Duration 12 weeks    PT  Treatment/Interventions ADLs/Self Care Home Management;Aquatic Therapy;Cryotherapy;Electrical Stimulation;Iontophoresis 4mg /ml Dexamethasone;Moist Heat;Ultrasound;Functional mobility training;Therapeutic activities;Therapeutic exercise;Balance training;Neuromuscular re-education;Patient/family education;Manual techniques;Dry needling;Spinal Manipulations;Joint Manipulations;Passive range of motion    PT Next Visit Plan manual work to the lumbar and thoracic and gluteal, pelvic tilt, hip strength for abduction, stretch hip extensor and hip adductors    PT Home Exercise Plan    Recommended Other Services sent MD renewal    Consulted and Agree with Plan of Care Patient           Patient will benefit from skilled therapeutic intervention in order to improve the following deficits and impairments:  Increased fascial restricitons,Pain,Decreased activity tolerance,Hypomobility,Impaired flexibility,Improper body mechanics,Decreased mobility,Postural dysfunction,Decreased range of motion,Decreased endurance,Increased muscle spasms,Decreased balance,Decreased strength  Visit Diagnosis: Coccyxdynia - Plan: PT plan of care cert/re-cert  Other symptoms and signs involving the musculoskeletal system - Plan: PT plan of care cert/re-cert  Abnormal posture - Plan: PT plan of care cert/re-cert  Other muscle spasm - Plan: PT plan of care cert/re-cert  Muscle weakness (generalized) - Plan: PT plan of care cert/re-cert     Problem List There are no problems to display for this patient.   Eulis FosterCheryl Yared Susan, PT 09/19/20 10:58 AM   [facility] [address], [address] [place], [place], [address] Phone: [phone]   Fax:  [phone]  Name: Maye HidesBridget Kuiken MRN: [id number] Date of Birth: 20-Oct-1960

## 2020-09-24 ENCOUNTER — Encounter: Payer: Self-pay | Admitting: Medical-Surgical

## 2020-09-24 ENCOUNTER — Other Ambulatory Visit: Payer: Self-pay

## 2020-09-24 ENCOUNTER — Ambulatory Visit (INDEPENDENT_AMBULATORY_CARE_PROVIDER_SITE_OTHER): Payer: BC Managed Care – PPO | Admitting: Medical-Surgical

## 2020-09-24 VITALS — BP 145/82 | HR 74 | Temp 98.0°F | Resp 20 | Ht 66.25 in | Wt 264.0 lb

## 2020-09-24 DIAGNOSIS — G8929 Other chronic pain: Secondary | ICD-10-CM

## 2020-09-24 DIAGNOSIS — M545 Low back pain, unspecified: Secondary | ICD-10-CM | POA: Diagnosis not present

## 2020-09-24 DIAGNOSIS — M533 Sacrococcygeal disorders, not elsewhere classified: Secondary | ICD-10-CM

## 2020-09-24 NOTE — Progress Notes (Signed)
Subjective:    CC: Coccydynia follow-up  HPI: Pleasant 60 year old female presenting today for follow-up on pain in the sacrum/coccyx area.  Since her last visit, she has been to physical therapy and has finally been able to see the pelvic physical therapist.  She has only had 1 appointment with pelvic PT so far.  No significant improvement in her pain.  She did try meloxicam but it did not help so she discontinued the medication.  Taking Aleve/ibuprofen as needed with minimal relief.  I reviewed the past medical history, family history, social history, surgical history, and allergies today and no changes were needed.  Please see the problem list section below in epic for further details.  Past Medical History: Past Medical History:  Diagnosis Date  . Abnormal Pap smear of cervix   . Colon polyps   . Thyroid disease   . Venous insufficiency    Past Surgical History: Past Surgical History:  Procedure Laterality Date  . ABDOMINAL HYSTERECTOMY    . ABLATION    . BREAST EXCISIONAL BIOPSY Left    Age 24   Social History: Social History   Socioeconomic History  . Marital status: Married    Spouse name: Not on file  . Number of children: Not on file  . Years of education: Not on file  . Highest education level: Not on file  Occupational History  . Not on file  Tobacco Use  . Smoking status: Former Smoker    Quit date: 1987    Years since quitting: 35.4  . Smokeless tobacco: Never Used  Vaping Use  . Vaping Use: Never used  Substance and Sexual Activity  . Alcohol use: Not Currently  . Drug use: Never  . Sexual activity: Yes    Partners: Male    Birth control/protection: Surgical, Post-menopausal  Other Topics Concern  . Not on file  Social History Narrative  . Not on file   Social Determinants of Health   Financial Resource Strain: Not on file  Food Insecurity: Not on file  Transportation Needs: Not on file  Physical Activity: Not on file  Stress: Not on file   Social Connections: Not on file   Family History: Family History  Problem Relation Age of Onset  . Hypertension Other   . Heart attack Other   . Diabetes Other   . Stroke Other   . Skin cancer Other   . Colon cancer Other   . Prostate cancer Other   . Cancer Other    Allergies: Allergies  Allergen Reactions  . Meperidine Hcl Itching  . Morphine Anaphylaxis, Itching, Shortness Of Breath and Swelling  . Tramadol Itching  . Acetaminophen Hives and Itching  . Codeine Hives, Itching, Nausea And Vomiting and Swelling  . Sulfamethoxazole-Trimethoprim Hives and Itching   Medications: See med rec.  Review of Systems: See HPI for pertinent positives and negatives.   Objective:    General: Well Developed, well nourished, and in no acute distress.  Neuro: Alert and oriented x3.  HEENT: Normocephalic, atraumatic.  Skin: Warm and dry. Cardiac: Regular rate and rhythm, no murmurs rubs or gallops, no lower extremity edema.  Respiratory: Clear to auscultation bilaterally. Not using accessory muscles, speaking in full sentences.  Impression and Recommendations:    1. Coccyodynia X-rays negative for indication of cause for coccydynia.  Continue pelvic physical therapy as scheduled.  Discussed pharmaceutical management the patient would like to hold off on starting a medication at this time.  We will go  ahead and get an MRI of the sacrum and SI joints without contrast for further evaluation. - MR SACRUM SI JOINTS WO CONTRAST; Future  2. Chronic right-sided low back pain without sciatica Since she does have some radiation to the right side from the low back also getting an MRI of the lumbar spine. - MR Lumbar Spine Wo Contrast; Future  Return if symptoms worsen or fail to improve. ___________________________________________ Thayer Ohm, DNP, APRN, FNP-BC Primary Care and Sports Medicine [facility]

## 2020-09-27 ENCOUNTER — Telehealth: Payer: Self-pay | Admitting: Medical-Surgical

## 2020-09-27 NOTE — Telephone Encounter (Signed)
Peer to peer completed successfully. Both MRI of the lumbar spine and pelvis approved. Approval # [id number], good from 09/26/2020 through 11/24/2020.   Thayer Ohm, DNP, APRN, FNP-BC [facility] and Sports Medicine

## 2020-10-07 ENCOUNTER — Other Ambulatory Visit: Payer: Self-pay | Admitting: Medical-Surgical

## 2020-10-07 MED ORDER — TRIAZOLAM 0.25 MG PO TABS: ORAL_TABLET | ORAL | 0 refills | Status: DC

## 2020-10-07 NOTE — Progress Notes (Deleted)
Virtual Visit via Telephone   I connected with  Mariah Jones  on 10/07/20 by telephone/telehealth and verified that I am speaking with the correct person using two identifiers.   I discussed the limitations, risks, security and privacy concerns of performing an evaluation and management service by telephone, including the higher likelihood of inaccurate diagnosis and treatment, and the availability of in person appointments.  We also discussed the likely need of an additional face to face encounter for complete and high quality delivery of care.  I also discussed with the patient that there may be a patient responsible charge related to this service. The patient expressed understanding and wishes to proceed.  Provider location is in medical facility. Patient location is at their home, different from provider location. People involved in care of the patient during this telehealth encounter were myself, my nurse/medical assistant, and my front office/scheduling team member.  CC:  HPI:   Review of Systems: See HPI for pertinent positives and negatives.   Objective Findings:    General: Speaking full sentences, no audible heavy breathing.  Sounds alert and appropriately interactive.    Impression and Recommendations:      I discussed the above assessment and treatment plan with the patient. The patient was provided an opportunity to ask questions and all were answered. The patient agreed with the plan and demonstrated an understanding of the instructions.   The patient was advised to call back or seek an in-person evaluation if the symptoms worsen or if the condition fails to improve as anticipated.  minutes of non-face-to-face time was provided during this encounter.  No follow-ups on file. ___________________________________________ Christen Butter, DNP, APRN, FNP-BC Primary Care and Sports Medicine [facility]

## 2020-10-11 ENCOUNTER — Encounter: Payer: Self-pay | Admitting: Physical Therapy

## 2020-10-11 ENCOUNTER — Other Ambulatory Visit: Payer: Self-pay

## 2020-10-11 ENCOUNTER — Ambulatory Visit: Payer: BC Managed Care – PPO | Attending: Medical-Surgical | Admitting: Physical Therapy

## 2020-10-11 DIAGNOSIS — R29898 Other symptoms and signs involving the musculoskeletal system: Secondary | ICD-10-CM | POA: Insufficient documentation

## 2020-10-11 DIAGNOSIS — M533 Sacrococcygeal disorders, not elsewhere classified: Secondary | ICD-10-CM | POA: Diagnosis not present

## 2020-10-11 DIAGNOSIS — M6281 Muscle weakness (generalized): Secondary | ICD-10-CM | POA: Diagnosis present

## 2020-10-11 DIAGNOSIS — R293 Abnormal posture: Secondary | ICD-10-CM | POA: Insufficient documentation

## 2020-10-11 DIAGNOSIS — M62838 Other muscle spasm: Secondary | ICD-10-CM | POA: Diagnosis present

## 2020-10-11 NOTE — Therapy (Signed)
[facility] [address] [place], [place], [address] Phone: [phone]   Fax:  [phone]  Physical Therapy Treatment  Patient Details  Name: Mariah Jones MRN: [id number] Date of Birth: 08-Feb-1961 Referring Provider (PT): Christen Butter, NP   Encounter Date: 10/11/2020   PT End of Session - 10/11/20 1024     Visit Number 6    Date for PT Re-Evaluation 12/23/20    Authorization Type BCBS    Authorization - Visit Number 6    Authorization - Number of Visits 30    PT Start Time 0930    PT Stop Time 1015    PT Time Calculation (min) 45 min    Activity Tolerance Patient tolerated treatment well;No increased pain    Behavior During Therapy WFL for tasks assessed/performed             Past Medical History:  Diagnosis Date   Abnormal Pap smear of cervix    Colon polyps    Thyroid disease    Venous insufficiency     Past Surgical History:  Procedure Laterality Date   ABDOMINAL HYSTERECTOMY     ABLATION     BREAST EXCISIONAL BIOPSY Left    Age 28    There were no vitals filed for this visit.   Subjective Assessment - 10/11/20 0935     Subjective I am having a MRI on my back and sacrum. I felt better after therapy but the pain came back the next day. Coccyx has been feeling better. Most pain in the back.    Patient Stated Goals get rid of pain - pain free    Currently in Pain? Yes    Pain Location Coccyx    Pain Orientation Right;Mid    Pain Descriptors / Indicators Sharp;Other (Comment)   deep   Pain Type Chronic pain    Pain Onset More than a month ago    Pain Frequency Intermittent    Aggravating Factors  sitting car seat, hard padded chairs,    Pain Relieving Factors sitting and leaning to left    Multiple Pain Sites Yes    Pain Score 7    Pain Location Sacrum   wraps around the buttokcs   Pain Orientation Mid;Right    Pain Descriptors / Indicators Constant    Pain Type Chronic pain    Pain  Radiating Towards radiates from the sacrum to the gluteus medius, wakes up and not able to stand up straight, unable to lay right side    Pain Onset More than a month ago    Aggravating Factors  wakes up and not able to stand up straight, unable to lay right side; driving, being in the bed    Pain Relieving Factors uses a ball to massage and hurts good                [facility] PT Assessment - 10/11/20 0001       Posture/Postural Control   Posture/Postural Control Postural limitations    Posture Comments right lateral shift , feels better to off load the right leg                           [facility] Adult PT Treatment/Exercise - 10/11/20 0001       Lumbar Exercises: Prone   Other Prone Lumbar Exercises laying prone with belt to shift hips to the left monitoring for centralization,  Modalities   Modalities Iontophoresis      Iontophoresis   Type of Iontophoresis Dexamethasone    Location right SI joint    Dose 1 ml, #1    Time 6 hour patch      Manual Therapy   Manual Therapy Joint mobilization;Soft tissue mobilization;Myofascial release    Joint Mobilization PA and rotational mobilization to right L4-5 and L5-S1 while monitoring for pain and centralization; sacral mobilization to rotate to the left    Soft tissue mobilization soft tissue work to the right gluteal and right lumbar paraspinals    Myofascial Release using suction cup on the lumbar and right gluteal witn increased senstivity on the right gluteus medius    Other Manual Therapy prone moving the hip to the left with a belt to centralize the pain; standing with lateral shift to move the hips to the left whiel trying to centralize the pain                    PT Education - 10/11/20 1023     Education Details information on iontophoresis and to take the patch off at 4 PM    Person(s) Educated Patient    Methods Explanation;Handout    Comprehension Verbalized understanding                  PT Long Term Goals - 09/19/20 1054       PT LONG TERM GOAL #1   Title Decrease pain to 0/10 to 2/10 allowing patient to sit for > 20 min with upright posture    Time 6    Period Weeks    Status On-going      PT LONG TERM GOAL #2   Title Increase lumbar mobility to 80-85% of normal range with no pain to perfrom daily tasks    Time 6    Period Weeks    Status Revised      PT LONG TERM GOAL #3   Title Minimal to no tendetness to palpation through the buttocks into the coccyx making it easier for her to sit and drive and due to pelvis and coccyc  in correct alignment    Time 6    Period Weeks    Status Revised      PT LONG TERM GOAL #4   Title Independent in HEP for core and hip strength to improve function    Time 6    Period Weeks    Status Revised      PT LONG TERM GOAL #5   Title Improve functional limitatioin score to 59    Time 6    Period Weeks    Status On-going                   Plan - 10/11/20 1025     Clinical Impression Statement Patient is having less coccyx pain since last visit. She is having more right sacral to the buttocks pain. Patient is going to have a MRI of her lumbar and sacrum. Patient had decreased mobility of L4-S1 and sacrum is rotated to the right. She has trigger points in the right gluteus medius and right side fo L5. After manual work her pain was reduced. She has pinpoint pain in the right SI joint. One point she had no pain in the area in extension with mobilization to the vertebrae. Patient has decreased weightbear on the right. She feels best in standig wtih dropping the right hip and unweight the  right leg. Patient will benefit from skilled therapy to improve tissue mobility and strength to reduce pain and improve quality of life.    Personal Factors and Comorbidities Fitness;Time since onset of injury/illness/exacerbation;Comorbidity 1    Comorbidities abdominal hysterectomy    Examination-Activity Limitations  Sit;Locomotion Level;Bed Mobility;Stand    Examination-Participation Restrictions Community Activity;Driving;Occupation    Stability/Clinical Decision Making Stable/Uncomplicated    Rehab Potential Excellent    PT Frequency 1x / week    PT Duration 12 weeks    PT Treatment/Interventions ADLs/Self Care Home Management;Aquatic Therapy;Cryotherapy;Electrical Stimulation;Iontophoresis 4mg /ml Dexamethasone;Moist Heat;Ultrasound;Functional mobility training;Therapeutic activities;Therapeutic exercise;Balance training;Neuromuscular re-education;Patient/family education;Manual techniques;Dry needling;Spinal Manipulations;Joint Manipulations;Passive range of motion    PT Next Visit Plan see how the ionto did, check on MRI; manual work to the lumbar and sacrum; hip strength for the gluteus medius, stretch hip extensor and adductors    PT Home Exercise Plan    Recommended Other Services MD signed both initial and renewal    Consulted and Agree with Plan of Care Patient             Patient will benefit from skilled therapeutic intervention in order to improve the following deficits and impairments:  Increased fascial restricitons, Pain, Decreased activity tolerance, Hypomobility, Impaired flexibility, Improper body mechanics, Decreased mobility, Postural dysfunction, Decreased range of motion, Decreased endurance, Increased muscle spasms, Decreased balance, Decreased strength  Visit Diagnosis: Coccyxdynia  Other symptoms and signs involving the musculoskeletal system  Abnormal posture  Other muscle spasm  Muscle weakness (generalized)     Problem List There are no problems to display for this patient.   [email or web address], PT 10/11/20 10:32 AM  [facility] [address], [address] [place], [place], [place] Phone: [phone]   Fax:  [phone]  Name: Meredith Kilbride MRN: [name] Date of Birth: 06-21-1960

## 2020-10-11 NOTE — Patient Instructions (Signed)

## 2020-10-12 ENCOUNTER — Ambulatory Visit (INDEPENDENT_AMBULATORY_CARE_PROVIDER_SITE_OTHER): Payer: BC Managed Care – PPO

## 2020-10-12 DIAGNOSIS — M533 Sacrococcygeal disorders, not elsewhere classified: Secondary | ICD-10-CM

## 2020-10-12 DIAGNOSIS — M545 Low back pain, unspecified: Secondary | ICD-10-CM

## 2020-10-12 DIAGNOSIS — G8929 Other chronic pain: Secondary | ICD-10-CM | POA: Diagnosis not present

## 2020-10-14 ENCOUNTER — Encounter: Payer: Self-pay | Admitting: Physical Therapy

## 2020-10-14 ENCOUNTER — Ambulatory Visit: Payer: BC Managed Care – PPO | Admitting: Physical Therapy

## 2020-10-14 ENCOUNTER — Encounter: Payer: Self-pay | Admitting: Medical-Surgical

## 2020-10-14 ENCOUNTER — Other Ambulatory Visit: Payer: Self-pay

## 2020-10-14 DIAGNOSIS — M6281 Muscle weakness (generalized): Secondary | ICD-10-CM

## 2020-10-14 DIAGNOSIS — M62838 Other muscle spasm: Secondary | ICD-10-CM

## 2020-10-14 DIAGNOSIS — R29898 Other symptoms and signs involving the musculoskeletal system: Secondary | ICD-10-CM

## 2020-10-14 DIAGNOSIS — R293 Abnormal posture: Secondary | ICD-10-CM

## 2020-10-14 DIAGNOSIS — M533 Sacrococcygeal disorders, not elsewhere classified: Secondary | ICD-10-CM | POA: Diagnosis not present

## 2020-10-14 NOTE — Therapy (Signed)
[facility] [address] [place], [place], [address] Phone: [phone]   Fax:  [phone]  Physical Therapy Treatment  Patient Details  Name: Mariah Jones MRN: [id number] Date of Birth: 01-Jun-1960 Referring Provider (PT): Christen Butter, NP   Encounter Date: 10/14/2020   PT End of Session - 10/14/20 1154     Visit Number 7    Number of Visits 0    Date for PT Re-Evaluation 12/23/20    Authorization Type BCBS    Authorization - Visit Number 7    Authorization - Number of Visits 30    PT Start Time 0930    PT Stop Time 1015    PT Time Calculation (min) 45 min    Activity Tolerance Patient tolerated treatment well;No increased pain    Behavior During Therapy WFL for tasks assessed/performed             Past Medical History:  Diagnosis Date   Abnormal Pap smear of cervix    Colon polyps    Thyroid disease    Venous insufficiency     Past Surgical History:  Procedure Laterality Date   ABDOMINAL HYSTERECTOMY     ABLATION     BREAST EXCISIONAL BIOPSY Left    Age 60    There were no vitals filed for this visit.   Subjective Assessment - 10/14/20 0937     Subjective The patch made the pain go away until I took it off.    Patient Stated Goals get rid of pain - pain free    Currently in Pain? Yes    Pain Score 1     Pain Location Coccyx    Pain Orientation Right;Mid    Pain Descriptors / Indicators Sharp;Other (Comment)   deep   Pain Type Chronic pain    Pain Onset More than a month ago    Pain Frequency Intermittent    Aggravating Factors  sitting    Pain Relieving Factors noit sitting    Multiple Pain Sites Yes    Pain Score 7    Pain Location Sacrum   wraps around the buttocks   Pain Orientation Right;Mid    Pain Descriptors / Indicators Constant    Pain Type Chronic pain    Pain Radiating Towards radiates from the sacrum to the gluteus medius, wakes up and not able to stand up straight,  unable to lay right side    Pain Onset More than a month ago    Pain Frequency Constant    Aggravating Factors  wakes up and not able to stand up straight, unable to lay right side, dirving, being in the bed    Pain Relieving Factors uses a ball to massage and hurts good                               [facility] Adult PT Treatment/Exercise - 10/14/20 0001       Modalities   Modalities Traction;Iontophoresis      Iontophoresis   Type of Iontophoresis Dexamethasone    Location right SI joint   checked the skin and it is clear   Dose 1 ml, #2    Time 6 hour patch      Traction   Type of Traction Lumbar    Max (lbs) 70   then increased to 75 then reduced to 55, then left leg was going numb so stopped the traction  Time 8 min                    PT Education - 10/14/20 1154     Education Details take ionto off at 16:00    Person(s) Educated Patient    Comprehension Verbalized understanding                 PT Long Term Goals - 10/14/20 1205       PT LONG TERM GOAL #1   Title Decrease pain to 0/10 to 2/10 allowing patient to sit for > 20 min with upright posture    Time 6    Period Weeks    Status On-going      PT LONG TERM GOAL #2   Title Increase lumbar mobility to 80-85% of normal range with no pain to perfrom daily tasks    Time 6    Period Weeks    Status Revised      PT LONG TERM GOAL #3   Title Minimal to no tendetness to palpation through the buttocks into the coccyx making it easier for her to sit and drive and due to pelvis and coccyc  in correct alignment    Time 6    Period Weeks    Status Revised      PT LONG TERM GOAL #4   Title Independent in HEP for core and hip strength to improve function    Time 6    Period Weeks    Status Revised      PT LONG TERM GOAL #5   Title Improve functional limitatioin score to 59    Time 6    Period Weeks    Status On-going                   Plan - 10/14/20 1155      Clinical Impression Statement Patient has had her MRI on the lumbar and sacrum. The ionto helped while on the right sacral area but once it is off then the pain and sensitivity. Patient tried the lumbar traction and had her left leg fall asleep and tingling. Once she was off the traction table the symptoms reduced. Patient coccyx pain is decreased to 1/10 but her sacral pain could be aggravating her coccyx pain. Patient reports she reduced height since the injury. Patient will be on a cruise and will not be able to get in till August. Hopefully she has talked to the doctor for a plan with her lumbar spine.    Personal Factors and Comorbidities Fitness;Time since onset of injury/illness/exacerbation;Comorbidity 1    Comorbidities abdominal hysterectomy    Examination-Activity Limitations Sit;Locomotion Level;Bed Mobility;Stand    Examination-Participation Restrictions Community Activity;Driving;Occupation    Stability/Clinical Decision Making Stable/Uncomplicated    Rehab Potential Excellent    PT Frequency 1x / week    PT Duration 12 weeks    PT Treatment/Interventions ADLs/Self Care Home Management;Aquatic Therapy;Cryotherapy;Electrical Stimulation;Iontophoresis 4mg /ml Dexamethasone;Moist Heat;Ultrasound;Functional mobility training;Therapeutic activities;Therapeutic exercise;Balance training;Neuromuscular re-education;Patient/family education;Manual techniques;Dry needling;Spinal Manipulations;Joint Manipulations;Passive range of motion    PT Next Visit Plan see if she has talked tto the doctor about her lumbar spine; hip strength for the gluteus medius, coccyx release, stretch the hip extensor and adductors    PT Home Exercise Plan    Consulted and Agree with Plan of Care Patient             Patient will benefit from skilled therapeutic intervention in order to improve the following deficits  and impairments:  Increased fascial restricitons, Pain, Decreased activity tolerance,  Hypomobility, Impaired flexibility, Improper body mechanics, Decreased mobility, Postural dysfunction, Decreased range of motion, Decreased endurance, Increased muscle spasms, Decreased balance, Decreased strength  Visit Diagnosis: Coccyxdynia  Other symptoms and signs involving the musculoskeletal system  Abnormal posture  Other muscle spasm  Muscle weakness (generalized)     Problem List There are no problems to display for this patient.   Eulis Foster, PT 10/14/20 12:07 PM  [facility] [address], [address] [place], [place], [address] Phone: [phone]   Fax:  [phone]  Name: Mariah Jones MRN: [id number] Date of Birth: 1960/06/28

## 2020-11-28 ENCOUNTER — Ambulatory Visit: Payer: BC Managed Care – PPO | Attending: Medical-Surgical | Admitting: Physical Therapy

## 2020-11-28 ENCOUNTER — Other Ambulatory Visit: Payer: Self-pay

## 2020-11-28 ENCOUNTER — Encounter: Payer: Self-pay | Admitting: Physical Therapy

## 2020-11-28 DIAGNOSIS — M62838 Other muscle spasm: Secondary | ICD-10-CM

## 2020-11-28 DIAGNOSIS — M6281 Muscle weakness (generalized): Secondary | ICD-10-CM

## 2020-11-28 DIAGNOSIS — R293 Abnormal posture: Secondary | ICD-10-CM

## 2020-11-28 DIAGNOSIS — R29898 Other symptoms and signs involving the musculoskeletal system: Secondary | ICD-10-CM | POA: Diagnosis present

## 2020-11-28 DIAGNOSIS — M533 Sacrococcygeal disorders, not elsewhere classified: Secondary | ICD-10-CM

## 2020-11-28 NOTE — Therapy (Addendum)
[facility] [address] [place], [place], [address] Phone: [phone]   Fax:  [phone]  Physical Therapy Treatment  Patient Details  Name: Mariah Jones MRN: [id number] Date of Birth: [birth year] Referring Provider (PT): Samuel Bouche, NP   Encounter Date: 11/28/2020   PT End of Session - 11/28/20 1310     Visit Number 8    Date for PT Re-Evaluation 12/23/20    Authorization Type BCBS    Authorization - Visit Number 8    Authorization - Number of Visits 30    PT Start Time 0930    PT Stop Time 1010    PT Time Calculation (min) 40 min    Activity Tolerance Patient tolerated treatment well;No increased pain    Behavior During Therapy WFL for tasks assessed/performed             Past Medical History:  Diagnosis Date   Abnormal Pap smear of cervix    Colon polyps    Thyroid disease    Venous insufficiency     Past Surgical History:  Procedure Laterality Date   ABDOMINAL HYSTERECTOMY     ABLATION     BREAST EXCISIONAL BIOPSY Left    Age 60    There were no vitals filed for this visit.   Subjective Assessment - 11/28/20 0939     Subjective The traction did not help. The pain is still there. I have not seen the doctor so have not gone over the MRI results. I am leaving next week for [place].    Patient Stated Goals get rid of pain - pain free    Currently in Pain? Yes    Pain Score 1     Pain Location Coccyx    Pain Orientation Right;Mid    Pain Descriptors / Indicators Sharp    Pain Type Chronic pain    Pain Onset More than a month ago    Pain Frequency Intermittent    Aggravating Factors  sitting    Pain Relieving Factors not siting    Multiple Pain Sites Yes    Pain Score 5    Pain Location Sacrum    Pain Orientation Right;Mid    Pain Descriptors / Indicators Constant    Pain Type Chronic pain    Pain Radiating Towards radistes from the sacrum to the gluteus medius, wakes up and not able  to stand up straight, unable to lay right side    Pain Onset More than a month ago    Pain Frequency Constant    Aggravating Factors  wakes up and not able to stand up straight, unable to lay right side, driving, being in the bed    Pain Relieving Factors uses a ball to massage and hurts good                               [facility] Adult PT Treatment/Exercise - 11/28/20 0001       Manual Therapy   Manual Therapy Myofascial release;Soft tissue mobilization    Soft tissue mobilization soft tissue mobilization to the thoracic area, lumbar area, quadratus, along the lateral trunk sides, the glureals, coccygeus, hip rotators, and along the SI joint in prone    Myofascial Release using suction cups along the thoracic, lumbar and gluteals to release the fascia and saw alot of erythemia due to the releases  PT Long Term Goals - 10/14/20 1205       PT LONG TERM GOAL #1   Title Decrease pain to 0/10 to 2/10 allowing patient to sit for > 20 min with upright posture    Time 6    Period Weeks    Status On-going      PT LONG TERM GOAL #2   Title Increase lumbar mobility to 80-85% of normal range with no pain to perfrom daily tasks    Time 6    Period Weeks    Status Revised      PT LONG TERM GOAL #3   Title Minimal to no tendetness to palpation through the buttocks into the coccyx making it easier for her to sit and drive and due to pelvis and coccyc  in correct alignment    Time 6    Period Weeks    Status Revised      PT LONG TERM GOAL #4   Title Independent in HEP for core and hip strength to improve function    Time 6    Period Weeks    Status Revised      PT LONG TERM GOAL #5   Title Improve functional limitatioin score to 60    Time 6    Period Weeks    Status On-going                   Plan - 11/28/20 1311     Clinical Impression Statement Patient coccyx pain decreased to 1/10 after manual work and her back  pain. Her coccyx pain is 50% better. She has increased pain in the lumbar that will radiate into the right gluteus and left lower extremity. Patient still needs to consult with her doctor to come up with a plan for her back. She is traveling alot making it difficult to see her doctor. Patient has not met her goals yet. She should consult someone about her lumbar spine due to the results of her spine.    Personal Factors and Comorbidities Fitness;Time since onset of injury/illness/exacerbation;Comorbidity 1    Comorbidities abdominal hysterectomy    Examination-Activity Limitations Sit;Locomotion Level;Bed Mobility;Stand    Examination-Participation Restrictions Community Activity;Driving;Occupation    Stability/Clinical Decision Making Stable/Uncomplicated    Rehab Potential Excellent    PT Frequency 1x / week    PT Duration 12 weeks    PT Treatment/Interventions ADLs/Self Care Home Management;Aquatic Therapy;Cryotherapy;Electrical Stimulation;Iontophoresis 18m/ml Dexamethasone;Moist Heat;Ultrasound;Functional mobility training;Therapeutic activities;Therapeutic exercise;Balance training;Neuromuscular re-education;Patient/family education;Manual techniques;Dry needling;Spinal Manipulations;Joint Manipulations;Passive range of motion    PT Next Visit Plan see if she has talked tto the doctor about her lumbar spine; hip strength for the gluteus medius, coccyx release, stretch the hip extensor and adductors    PT Home Exercise Plan [id number]   Consulted and Agree with Plan of Care Patient             Patient will benefit from skilled therapeutic intervention in order to improve the following deficits and impairments:  Increased fascial restricitons, Pain, Decreased activity tolerance, Hypomobility, Impaired flexibility, Improper body mechanics, Decreased mobility, Postural dysfunction, Decreased range of motion, Decreased endurance, Increased muscle spasms, Decreased balance, Decreased  strength  Visit Diagnosis: Coccyxdynia  Other symptoms and signs involving the musculoskeletal system  Abnormal posture  Other muscle spasm  Muscle weakness (generalized)     Problem List There are no problems to display for this patient.   CEarlie Counts PT 11/28/20 1:17 PM  [facility]  [place] [address] [place], [place], [address] Phone: [phone]   Fax:  [phone]  Name: Mariah Jones MRN: [id number] Date of Birth: December 20, 1960  PHYSICAL THERAPY DISCHARGE SUMMARY  Visits from Start of Care: 8  Current functional level related to goals / functional outcomes: See above. Unable to assess patient due to her not returning to therapy. Patient pain was coming more from her back than her coccyx at the last time she was seen in therapy.    Remaining deficits: See above.    Education / Equipment: HEP   Patient agrees to discharge. Patient goals were not met. Patient is being discharged due to not returning since the last visit. Thank you for the referral. Earlie Counts, PT 01/29/21 12:21 PM

## 2020-12-05 ENCOUNTER — Encounter: Payer: BC Managed Care – PPO | Admitting: Physical Therapy

## 2020-12-19 ENCOUNTER — Ambulatory Visit: Payer: BC Managed Care – PPO | Admitting: Physical Therapy

## 2020-12-26 ENCOUNTER — Encounter: Payer: BC Managed Care – PPO | Admitting: Physical Therapy

## 2021-01-02 ENCOUNTER — Encounter: Payer: BC Managed Care – PPO | Admitting: Physical Therapy

## 2021-01-08 ENCOUNTER — Encounter: Payer: BC Managed Care – PPO | Admitting: Physical Therapy

## 2021-02-17 ENCOUNTER — Ambulatory Visit: Payer: BC Managed Care – PPO | Admitting: Physical Therapy

## 2021-03-03 ENCOUNTER — Encounter: Payer: Self-pay | Admitting: Medical-Surgical

## 2021-03-28 ENCOUNTER — Telehealth: Payer: Self-pay

## 2021-03-28 NOTE — Telephone Encounter (Signed)
Transition Care Management Unsuccessful Follow-up Telephone Call  Date of discharge and from where:  03/27/2021 from [facility]  Attempts:  1st Attempt  Reason for unsuccessful TCM follow-up call:  Left voice message

## 2021-03-31 NOTE — Telephone Encounter (Signed)
Transition Care Management Unsuccessful Follow-up Telephone Call  Date of discharge and from where:  03/27/2021 from [facility]  Attempts:  2nd Attempt  Reason for unsuccessful TCM follow-up call:  Left voice message

## 2021-04-01 NOTE — Telephone Encounter (Signed)
Transition Care Management Unsuccessful Follow-up Telephone Call  Date of discharge and from where:  03/27/2021 from [facility]  Attempts:  3rd Attempt  Reason for unsuccessful TCM follow-up call:  Unable to reach patient

## 2021-04-15 ENCOUNTER — Telehealth: Payer: BC Managed Care – PPO | Admitting: Medical-Surgical

## 2021-04-17 ENCOUNTER — Encounter: Payer: Self-pay | Admitting: Medical-Surgical

## 2021-04-17 NOTE — Telephone Encounter (Signed)
Needs appointment to discuss.

## 2021-04-22 ENCOUNTER — Other Ambulatory Visit: Payer: Self-pay | Admitting: Medical-Surgical

## 2021-04-22 MED ORDER — GABAPENTIN 100 MG PO CAPS: 100.0000 mg | ORAL_CAPSULE | Freq: Three times a day (TID) | ORAL | 3 refills | Status: DC

## 2021-04-25 ENCOUNTER — Ambulatory Visit: Payer: BC Managed Care – PPO | Admitting: Medical-Surgical

## 2021-05-19 ENCOUNTER — Ambulatory Visit: Payer: BC Managed Care – PPO | Admitting: Medical-Surgical

## 2021-05-21 ENCOUNTER — Ambulatory Visit: Payer: BC Managed Care – PPO | Admitting: Medical-Surgical

## 2021-05-23 ENCOUNTER — Other Ambulatory Visit: Payer: Self-pay

## 2021-05-23 ENCOUNTER — Ambulatory Visit (INDEPENDENT_AMBULATORY_CARE_PROVIDER_SITE_OTHER): Payer: BC Managed Care – PPO | Admitting: Medical-Surgical

## 2021-05-23 ENCOUNTER — Encounter: Payer: Self-pay | Admitting: Medical-Surgical

## 2021-05-23 VITALS — BP 156/90 | HR 76 | Resp 20 | Ht 66.0 in | Wt 264.0 lb

## 2021-05-23 DIAGNOSIS — G8929 Other chronic pain: Secondary | ICD-10-CM

## 2021-05-23 DIAGNOSIS — M25562 Pain in left knee: Secondary | ICD-10-CM

## 2021-05-23 DIAGNOSIS — R600 Localized edema: Secondary | ICD-10-CM

## 2021-05-23 DIAGNOSIS — M545 Low back pain, unspecified: Secondary | ICD-10-CM | POA: Diagnosis not present

## 2021-05-23 DIAGNOSIS — I10 Essential (primary) hypertension: Secondary | ICD-10-CM | POA: Diagnosis not present

## 2021-05-23 MED ORDER — CHLORTHALIDONE 25 MG PO TABS: 25.0000 mg | ORAL_TABLET | Freq: Every day | ORAL | 1 refills | Status: DC

## 2021-05-23 MED ORDER — GABAPENTIN 300 MG PO CAPS: ORAL_CAPSULE | ORAL | 0 refills | Status: DC

## 2021-05-23 NOTE — Progress Notes (Addendum)
°  HPI with pertinent ROS:   CC: HTN, edema, knee pain  HPI: Pleasant 61 year old female presenting today for the following:  Hypertension-history of hypertension treated with Toprol-XL.  Reports that she changed her lifestyle and lost a lot of weight and was able to come off the medicine.  Over the past few months, notes her blood pressure has been significantly elevated.  Since she had COVID in April 2020, she has put on a lot of weight and she is worried about her blood pressure.  She is hesitant to try new medication and wonders if she should go back on metoprolol for blood pressure management.  Admits to adding salt to food when she cooks but not at the dinner table.  Denies CP, SOB, palpitations, dizziness, headaches, or vision changes.  Lower extremity edema-has already been evaluated by vascular surgery.  History of venous insufficiency currently being treated with conservative measures.  Wearing compression stockings but notes that her old compression stockings do not fit because her swelling is so severe.  Left knee pain-was evaluated the ED on 03/27/2021 for lower extremity edema and left leg pain.  She had a Doppler ultrasound which DVTs but did show an area in the back of her left knee that is suspected to be a luteal cyst.  Notes that her left leg is extremely painful and at times excruciating.  The pain is mostly in the back of her knee and does not seem to respond to gabapentin or other conservative measures.  I reviewed the past medical history, family history, social history, surgical history, and allergies today and no changes were needed.  Please see the problem list section below in epic for further details.   Physical exam:   General: Well Developed, well nourished, and in no acute distress.  Neuro: Alert and oriented x3.  HEENT: Normocephalic, atraumatic.  Skin: Warm and dry. Cardiac: Regular rate and rhythm, no murmurs rubs or gallops, + bilateral lower extremity edema.   Respiratory: Clear to auscultation bilaterally. Not using accessory muscles, speaking in full sentences.  Impression and Recommendations:    1. Chronic right-sided low back pain without sciatica Increasing gabapentin to 300 mg in the morning with 900 mg at night since she is worried about daytime sleepiness.  Advised that she is able to play around with dosing of this to find the best treatment for her that helps her back pain.  Refill sent in to new pharmacy.  2. Bilateral lower extremity edema Continue conservative measures as directed by Vascular surgery. Limit dietary sodium. Stay well hydrated. Starting Chlorthalidone.   3. Acute pain of left knee Some mild OA per the radiology report in Care Everywhere. Increasing gabapentin as above. Recommend evaluation by Dr. Karie Schwalbe at her earliest convenience.   4. Essential hypertension BP significantly elevated in the setting of weight gain and sedentary lifestyle. Discussed preferred medications. Toprol is not a bad choice but with her significant edema, would like to try a diuretic first. Starting Chlorthalidone 25mg  daily.  Would like for her to try to obtain a blood pressure cuff that measures on the arm for home monitoring.  Recommend checking blood pressure at least 3 times weekly and keeping a log.  Return in about 2 weeks (around 06/06/2021) for nurse visit for BP check. ___________________________________________ 08/04/2021, DNP, APRN, FNP-BC Primary Care and Sports Medicine [facility] [place]

## 2021-06-06 ENCOUNTER — Ambulatory Visit (INDEPENDENT_AMBULATORY_CARE_PROVIDER_SITE_OTHER): Payer: BC Managed Care – PPO

## 2021-06-06 ENCOUNTER — Other Ambulatory Visit: Payer: Self-pay

## 2021-06-06 ENCOUNTER — Ambulatory Visit (INDEPENDENT_AMBULATORY_CARE_PROVIDER_SITE_OTHER): Payer: BC Managed Care – PPO | Admitting: Sports Medicine

## 2021-06-06 DIAGNOSIS — S838X2A Sprain of other specified parts of left knee, initial encounter: Secondary | ICD-10-CM

## 2021-06-06 DIAGNOSIS — M170 Bilateral primary osteoarthritis of knee: Secondary | ICD-10-CM | POA: Insufficient documentation

## 2021-06-06 DIAGNOSIS — M1712 Unilateral primary osteoarthritis, left knee: Secondary | ICD-10-CM | POA: Insufficient documentation

## 2021-06-06 HISTORY — DX: Bilateral primary osteoarthritis of knee: M17.0

## 2021-06-06 MED ORDER — TRIAZOLAM 0.25 MG PO TABS: ORAL_TABLET | ORAL | 0 refills | Status: DC

## 2021-06-06 NOTE — Progress Notes (Addendum)
° ° °  Procedures performed today:    Procedure: Real-time Ultrasound Guided injection of the left knee Device: Samsung HS60  Verbal informed consent obtained.  Time-out conducted.  Noted no overlying erythema, induration, or other signs of local infection.  Skin prepped in a sterile fashion.  Local anesthesia: Topical Ethyl chloride.  With sterile technique and under real time ultrasound guidance: Trace effusion noted, 1 cc Kenalog 40, 2 cc lidocaine, 2 cc bupivacaine injected easily Completed without difficulty  Advised to call if fevers/chills, erythema, induration, drainage, or persistent bleeding.  Images permanently stored and available for review in PACS.  Impression: Technically successful ultrasound guided injection.  Independent interpretation of notes and tests performed by another provider:   None.  Brief History, Exam, Impression, and Recommendations:    Acute medial meniscal injury of left knee Pleasant 61 year old female, recent acute onset left knee pain, medial aspect, mild swelling. Positive Thessaly sign, positive McMurray's sign, pain with terminal flexion. X-rays recently done for most part unrevealing. Knee injection today, MRI due to locking, catching, buckling. Return to see me for MRI results.  Update: Not much better after injection, MRI with complex meniscal tearing, referral to Dr. Everardo Pacific for a surgical opinion.    ___________________________________________ Ihor Austin. Benjamin Stain, M.D., ABFM., CAQSM. Primary Care and Sports Medicine [facility]  Adjunct Instructor of Family Medicine  [employer]

## 2021-06-06 NOTE — Assessment & Plan Note (Addendum)
Pleasant 61 year old female, recent acute onset left knee pain, medial aspect, mild swelling. Positive Thessaly sign, positive McMurray's sign, pain with terminal flexion. X-rays recently done for most part unrevealing. Knee injection today, MRI due to locking, catching, buckling. Return to see me for MRI results.  Update: Not much better after injection, MRI with complex meniscal tearing, referral to Dr. Everardo Pacific for a surgical opinion.

## 2021-06-17 ENCOUNTER — Other Ambulatory Visit: Payer: Self-pay

## 2021-06-17 ENCOUNTER — Ambulatory Visit (INDEPENDENT_AMBULATORY_CARE_PROVIDER_SITE_OTHER): Payer: BC Managed Care – PPO

## 2021-06-17 DIAGNOSIS — S838X2A Sprain of other specified parts of left knee, initial encounter: Secondary | ICD-10-CM

## 2021-06-19 ENCOUNTER — Encounter: Payer: Self-pay | Admitting: Sports Medicine

## 2021-06-19 NOTE — Addendum Note (Signed)
Addended by: Monica Becton on: 06/19/2021 10:00 AM   Modules accepted: Orders

## 2021-07-08 ENCOUNTER — Other Ambulatory Visit: Payer: Self-pay | Admitting: Family Medicine

## 2021-07-08 DIAGNOSIS — Z1231 Encounter for screening mammogram for malignant neoplasm of breast: Secondary | ICD-10-CM

## 2021-07-15 DIAGNOSIS — M545 Low back pain, unspecified: Secondary | ICD-10-CM | POA: Insufficient documentation

## 2021-07-15 DIAGNOSIS — S83249A Other tear of medial meniscus, current injury, unspecified knee, initial encounter: Secondary | ICD-10-CM

## 2021-07-15 DIAGNOSIS — H4089 Other specified glaucoma: Secondary | ICD-10-CM | POA: Insufficient documentation

## 2021-07-15 DIAGNOSIS — E785 Hyperlipidemia, unspecified: Secondary | ICD-10-CM

## 2021-07-15 DIAGNOSIS — I10 Essential (primary) hypertension: Secondary | ICD-10-CM

## 2021-07-15 HISTORY — DX: Other tear of medial meniscus, current injury, unspecified knee, initial encounter: S83.249A

## 2021-07-15 HISTORY — DX: Hyperlipidemia, unspecified: E78.5

## 2021-07-15 HISTORY — DX: Essential (primary) hypertension: I10

## 2021-07-15 HISTORY — DX: Low back pain, unspecified: M54.50

## 2021-07-15 HISTORY — DX: Other specified glaucoma: H40.89

## 2021-07-21 ENCOUNTER — Other Ambulatory Visit: Payer: Self-pay

## 2021-07-21 ENCOUNTER — Ambulatory Visit (INDEPENDENT_AMBULATORY_CARE_PROVIDER_SITE_OTHER): Payer: BC Managed Care – PPO | Admitting: Sports Medicine

## 2021-07-21 DIAGNOSIS — M5136 Other intervertebral disc degeneration, lumbar region: Secondary | ICD-10-CM | POA: Insufficient documentation

## 2021-07-21 DIAGNOSIS — M51369 Other intervertebral disc degeneration, lumbar region without mention of lumbar back pain or lower extremity pain: Secondary | ICD-10-CM

## 2021-07-21 DIAGNOSIS — S838X2D Sprain of other specified parts of left knee, subsequent encounter: Secondary | ICD-10-CM

## 2021-07-21 HISTORY — DX: Other intervertebral disc degeneration, lumbar region without mention of lumbar back pain or lower extremity pain: M51.369

## 2021-07-21 MED ORDER — ONDANSETRON 8 MG PO TBDP: 8.0000 mg | ORAL_TABLET | Freq: Three times a day (TID) | ORAL | 3 refills | Status: DC | PRN

## 2021-07-21 MED ORDER — HYDROCODONE-IBUPROFEN 5-200 MG PO TABS: 1.0000 | ORAL_TABLET | Freq: Three times a day (TID) | ORAL | 0 refills | Status: DC | PRN

## 2021-07-21 NOTE — Progress Notes (Signed)
? ? ?  Procedures performed today:   ? ?None. ? ?Independent interpretation of notes and tests performed by another provider:  ? ?MRI personally reviewed, multilevel lumbar DDD, there is also multilevel facet arthritis worst from L3-L5 bilaterally. ? ?Brief History, Exam, Impression, and Recommendations:   ? ?Acute medial meniscal injury of left knee ?This is a 61 year old female, she has complex meniscal tearing left knee. ?We did an injection and she did not have much improvement, she does have operative intervention planned by Dr. Everardo Pacific in May. ?Adding Vicoprofen with a meal and Zofran premedication. ?Follow this up with Dr. Everardo Pacific. ? ?Lumbar degenerative disc disease ?Patient also has chronic low back pain, and MRI did show some DDD, pain is axial and discogenic without anything overtly radicular, axial pain is predominantly on the left. ?She also has multilevel facet arthritis worse from L3-L5. ?Proceeding with left L4-L5 interlaminar epidural, we will target the facets if insufficient improvement. ? ? ? ?___________________________________________ ?Ihor Austin. Benjamin Stain, M.D., ABFM., CAQSM. ?Primary Care and Sports Medicine ?[facility] ? ?Adjunct Instructor of Family Medicine  ?[employer] ?

## 2021-07-21 NOTE — Assessment & Plan Note (Signed)
This is a 61 year old female, she has complex meniscal tearing left knee. ?We did an injection and she did not have much improvement, she does have operative intervention planned by Dr. Everardo Pacific in May. ?Adding Vicoprofen with a meal and Zofran premedication. ?Follow this up with Dr. Everardo Pacific. ?

## 2021-07-21 NOTE — Assessment & Plan Note (Signed)
Patient also has chronic low back pain, and MRI did show some DDD, pain is axial and discogenic without anything overtly radicular, axial pain is predominantly on the left. ?She also has multilevel facet arthritis worse from L3-L5. ?Proceeding with left L4-L5 interlaminar epidural, we will target the facets if insufficient improvement. ?

## 2021-07-23 ENCOUNTER — Ambulatory Visit
Admission: RE | Admit: 2021-07-23 | Discharge: 2021-07-23 | Disposition: A | Discharge status: Home / Self Care | Payer: BC Managed Care – PPO | Source: Ambulatory Visit | Attending: Sports Medicine | Admitting: Sports Medicine

## 2021-07-23 ENCOUNTER — Other Ambulatory Visit: Payer: Self-pay

## 2021-07-23 ENCOUNTER — Encounter: Payer: Self-pay | Admitting: Sports Medicine

## 2021-07-23 ENCOUNTER — Ambulatory Visit: Payer: BC Managed Care – PPO | Admitting: Sports Medicine

## 2021-07-23 DIAGNOSIS — M5136 Other intervertebral disc degeneration, lumbar region: Secondary | ICD-10-CM

## 2021-07-23 MED ORDER — METHYLPREDNISOLONE ACETATE 40 MG/ML INJ SUSP (RADIOLOG
80.0000 mg | Freq: Once | INTRAMUSCULAR | Status: AC
  Administered 2021-07-23: 80 mg via EPIDURAL

## 2021-07-23 MED ORDER — IOPAMIDOL (ISOVUE-M 200) INJECTION 41%
1.0000 mL | Freq: Once | INTRAMUSCULAR | Status: AC
  Administered 2021-07-23: 1 mL via EPIDURAL

## 2021-07-23 NOTE — Discharge Instructions (Signed)

## 2021-08-11 ENCOUNTER — Other Ambulatory Visit: Payer: Self-pay | Admitting: Sports Medicine

## 2021-08-11 DIAGNOSIS — M5136 Other intervertebral disc degeneration, lumbar region: Secondary | ICD-10-CM

## 2021-08-11 DIAGNOSIS — M51369 Other intervertebral disc degeneration, lumbar region without mention of lumbar back pain or lower extremity pain: Secondary | ICD-10-CM

## 2021-08-28 ENCOUNTER — Ambulatory Visit: Payer: BC Managed Care – PPO

## 2021-08-28 ENCOUNTER — Ambulatory Visit (INDEPENDENT_AMBULATORY_CARE_PROVIDER_SITE_OTHER): Payer: BC Managed Care – PPO

## 2021-08-28 ENCOUNTER — Other Ambulatory Visit: Payer: Self-pay | Admitting: Medical-Surgical

## 2021-08-28 ENCOUNTER — Ambulatory Visit
Admission: RE | Admit: 2021-08-28 | Discharge: 2021-08-28 | Disposition: A | Discharge status: Home / Self Care | Payer: BC Managed Care – PPO | Source: Ambulatory Visit | Attending: Sports Medicine | Admitting: Sports Medicine

## 2021-08-28 ENCOUNTER — Ambulatory Visit (INDEPENDENT_AMBULATORY_CARE_PROVIDER_SITE_OTHER): Payer: BC Managed Care – PPO | Admitting: Sports Medicine

## 2021-08-28 DIAGNOSIS — M5136 Other intervertebral disc degeneration, lumbar region: Secondary | ICD-10-CM | POA: Diagnosis not present

## 2021-08-28 DIAGNOSIS — M1811 Unilateral primary osteoarthritis of first carpometacarpal joint, right hand: Secondary | ICD-10-CM | POA: Diagnosis not present

## 2021-08-28 DIAGNOSIS — Z1231 Encounter for screening mammogram for malignant neoplasm of breast: Secondary | ICD-10-CM

## 2021-08-28 DIAGNOSIS — S838X2D Sprain of other specified parts of left knee, subsequent encounter: Secondary | ICD-10-CM

## 2021-08-28 DIAGNOSIS — M51369 Other intervertebral disc degeneration, lumbar region without mention of lumbar back pain or lower extremity pain: Secondary | ICD-10-CM

## 2021-08-28 DIAGNOSIS — M180 Bilateral primary osteoarthritis of first carpometacarpal joints: Secondary | ICD-10-CM | POA: Insufficient documentation

## 2021-08-28 HISTORY — DX: Bilateral primary osteoarthritis of first carpometacarpal joints: M18.0

## 2021-08-28 MED ORDER — HYDROCODONE-IBUPROFEN 5-200 MG PO TABS: 1.0000 | ORAL_TABLET | Freq: Three times a day (TID) | ORAL | 0 refills | Status: DC | PRN

## 2021-08-28 NOTE — Assessment & Plan Note (Signed)
Unfortunately as injection did not provide much relief she does have operative intervention planned with Dr. Griffin Basil. ?Refilling Vicoprofen for now. ?

## 2021-08-28 NOTE — Discharge Instructions (Signed)

## 2021-08-28 NOTE — Assessment & Plan Note (Signed)
MRI does show multilevel facet arthritis L3-L5 and disc disease, we did a left L4-L5 interlaminar epidural that provided good enough relief, having recurrence of pain so she does have another epidural scheduled. ? ?We can certainly target the L3-L5 facets if recurrence of pain after the third epidural. ?

## 2021-08-28 NOTE — Progress Notes (Signed)
? ? ?  Procedures performed today:   ? ?Procedure: Real-time Ultrasound Guided injection of the right first [facility] ?Device: Samsung HS60  ?Verbal informed consent obtained.  ?Time-out conducted.  ?Noted no overlying erythema, induration, or other signs of local infection.  ?Skin prepped in a sterile fashion.  ?Local anesthesia: Topical Ethyl chloride.  ?With sterile technique and under real time ultrasound guidance: Noted arthritic joint, 1/2 cc lidocaine, 1/2 cc kenalog 40 injected easily. ?Completed without difficulty  ?Advised to call if fevers/chills, erythema, induration, drainage, or persistent bleeding.  ?Images permanently stored and available for review in PACS.  ?Impression: Technically successful ultrasound guided injection. ? ?Independent interpretation of notes and tests performed by another provider:  ? ?None. ? ?Brief History, Exam, Impression, and Recommendations:   ? ?Primary osteoarthritis of first carpometacarpal joint of right hand ?Mariah 61 year old Jones, increasing pain right first CMC, classic osteoarthritis with tenderness at the joint, injected today, return to see me in a month. ?We will get some x-rays and I am get a print out some thumb basal joint arthritis conditioning. ? ?Acute medial meniscal injury of left knee ?Unfortunately as injection did not provide much relief she does have operative intervention planned with Dr. Everardo Pacific. ?Refilling Vicoprofen for now. ? ?Lumbar degenerative disc disease ?MRI does show multilevel facet arthritis L3-L5 and disc disease, we did a left L4-L5 interlaminar epidural that provided good enough relief, having recurrence of pain so she does have another epidural scheduled. ? ?We can certainly target the L3-L5 facets if recurrence of pain after the third epidural. ? ? ? ?___________________________________________ ?Ihor Austin. Benjamin Stain, M.D., ABFM., CAQSM. ?Primary Care and Sports Medicine ?[facility] ? ?Adjunct Instructor of  Family Medicine  ?[employer] ?

## 2021-08-28 NOTE — Assessment & Plan Note (Signed)
Pleasant 61 year old female, increasing pain right first CMC, classic osteoarthritis with tenderness at the joint, injected today, return to see me in a month. ?We will get some x-rays and I am get a print out some thumb basal joint arthritis conditioning. ?

## 2021-09-12 ENCOUNTER — Other Ambulatory Visit: Payer: Self-pay | Admitting: Sports Medicine

## 2021-09-12 DIAGNOSIS — M5136 Other intervertebral disc degeneration, lumbar region: Secondary | ICD-10-CM

## 2021-09-12 DIAGNOSIS — M51369 Other intervertebral disc degeneration, lumbar region without mention of lumbar back pain or lower extremity pain: Secondary | ICD-10-CM

## 2021-09-17 ENCOUNTER — Encounter (INDEPENDENT_AMBULATORY_CARE_PROVIDER_SITE_OTHER): Payer: BC Managed Care – PPO | Admitting: Sports Medicine

## 2021-09-17 ENCOUNTER — Ambulatory Visit
Admission: RE | Admit: 2021-09-17 | Discharge: 2021-09-17 | Disposition: A | Discharge status: Home / Self Care | Payer: BC Managed Care – PPO | Source: Ambulatory Visit | Attending: Sports Medicine | Admitting: Sports Medicine

## 2021-09-17 DIAGNOSIS — M5136 Other intervertebral disc degeneration, lumbar region: Secondary | ICD-10-CM

## 2021-09-17 DIAGNOSIS — M51369 Other intervertebral disc degeneration, lumbar region without mention of lumbar back pain or lower extremity pain: Secondary | ICD-10-CM

## 2021-09-17 MED ORDER — METHYLPREDNISOLONE ACETATE 40 MG/ML INJ SUSP (RADIOLOG
80.0000 mg | Freq: Once | INTRAMUSCULAR | Status: AC
  Administered 2021-09-17: 80 mg via EPIDURAL

## 2021-09-17 MED ORDER — IOPAMIDOL (ISOVUE-M 200) INJECTION 41%
1.0000 mL | Freq: Once | INTRAMUSCULAR | Status: AC
Start: 2021-09-17 — End: 2021-09-17
  Administered 2021-09-17: 1 mL via EPIDURAL

## 2021-09-17 NOTE — Discharge Instructions (Signed)

## 2021-09-25 ENCOUNTER — Encounter: Payer: Self-pay | Admitting: Medical-Surgical

## 2021-10-06 ENCOUNTER — Ambulatory Visit: Payer: BC Managed Care – PPO | Admitting: Medical-Surgical

## 2021-10-10 NOTE — Telephone Encounter (Signed)
I spent 5 total minutes of online digital evaluation and management services in this patient-initiated request for online care. 

## 2021-10-13 ENCOUNTER — Ambulatory Visit: Payer: BC Managed Care – PPO | Admitting: Sports Medicine

## 2021-10-27 ENCOUNTER — Ambulatory Visit
Admission: RE | Admit: 2021-10-27 | Discharge: 2021-10-27 | Disposition: A | Discharge status: Home / Self Care | Payer: BC Managed Care – PPO | Source: Ambulatory Visit | Attending: Sports Medicine | Admitting: Sports Medicine

## 2021-10-27 DIAGNOSIS — M5136 Other intervertebral disc degeneration, lumbar region: Secondary | ICD-10-CM

## 2021-10-27 MED ORDER — IOPAMIDOL (ISOVUE-M 200) INJECTION 41%
1.0000 mL | Freq: Once | INTRAMUSCULAR | Status: AC
Start: 2021-10-27 — End: 2021-10-27
  Administered 2021-10-27: 1 mL via EPIDURAL

## 2021-10-27 MED ORDER — METHYLPREDNISOLONE ACETATE 40 MG/ML INJ SUSP (RADIOLOG
80.0000 mg | Freq: Once | INTRAMUSCULAR | Status: AC
  Administered 2021-10-27: 80 mg via EPIDURAL

## 2021-10-27 NOTE — Discharge Instructions (Signed)

## 2021-11-19 ENCOUNTER — Other Ambulatory Visit: Payer: Self-pay | Admitting: Medical-Surgical

## 2021-12-03 ENCOUNTER — Telehealth: Payer: Self-pay | Admitting: Sports Medicine

## 2021-12-03 ENCOUNTER — Ambulatory Visit: Payer: BC Managed Care – PPO | Admitting: Sports Medicine

## 2021-12-03 ENCOUNTER — Ambulatory Visit (INDEPENDENT_AMBULATORY_CARE_PROVIDER_SITE_OTHER): Payer: BC Managed Care – PPO

## 2021-12-03 ENCOUNTER — Encounter: Payer: Self-pay | Admitting: Sports Medicine

## 2021-12-03 DIAGNOSIS — Z8709 Personal history of other diseases of the respiratory system: Secondary | ICD-10-CM | POA: Diagnosis not present

## 2021-12-03 DIAGNOSIS — R0689 Other abnormalities of breathing: Secondary | ICD-10-CM

## 2021-12-03 HISTORY — DX: Personal history of other diseases of the respiratory system: Z87.09

## 2021-12-03 MED ORDER — BENZONATATE 200 MG PO CAPS: 200.0000 mg | ORAL_CAPSULE | Freq: Three times a day (TID) | ORAL | 0 refills | Status: DC | PRN

## 2021-12-03 MED ORDER — AZITHROMYCIN 250 MG PO TABS: ORAL_TABLET | ORAL | 0 refills | Status: DC

## 2021-12-03 MED ORDER — PREDNISONE 50 MG PO TABS: 50.0000 mg | ORAL_TABLET | Freq: Every day | ORAL | 0 refills | Status: DC

## 2021-12-03 MED ORDER — CHLORTHALIDONE 25 MG PO TABS: 25.0000 mg | ORAL_TABLET | Freq: Every day | ORAL | 3 refills | Status: DC

## 2021-12-03 NOTE — Assessment & Plan Note (Signed)
Pleasant 61 year old female, history of asthma, has not been on inhalers for a long time, has now had about 2 weeks of cough without any sick contacts, no malaise, constitutional symptoms, GI symptoms or other viral symptoms. No irritant exposure. Exam is benign and clear with the exception of left lower lobe coarse sounds. Adding chest x-ray, azithromycin, prednisone, Tessalon Perles

## 2021-12-03 NOTE — Telephone Encounter (Signed)
Visco approval please, left knee, x-ray confirmed osteoarthritis, failed surgery and steroid injections

## 2021-12-03 NOTE — Assessment & Plan Note (Signed)
Has had steroid injections, Vicoprofen, she did have an arthroscopy with Dr. Everardo Pacific, mechanical symptoms have resolved, unfortunately continues to have medial joint line pain, I will go ahead and get her set up for viscosupplementation as her residual pain is likely related to her underlying osteoarthritis.

## 2021-12-03 NOTE — Progress Notes (Signed)
    Procedures performed today:    None.  Independent interpretation of notes and tests performed by another provider:   None.  Brief History, Exam, Impression, and Recommendations:    History of asthma Pleasant 61 year old female, history of asthma, has not been on inhalers for a long time, has now had about 2 weeks of cough without any sick contacts, no malaise, constitutional symptoms, GI symptoms or other viral symptoms. No irritant exposure. Exam is benign and clear with the exception of left lower lobe coarse sounds. Adding chest x-ray, azithromycin, prednisone, Tessalon Perles  Primary osteoarthritis of left knee, status post meniscal debridement Has had steroid injections, Vicoprofen, she did have an arthroscopy with Dr. Everardo Pacific, mechanical symptoms have resolved, unfortunately continues to have medial joint line pain, I will go ahead and get her set up for viscosupplementation as her residual pain is likely related to her underlying osteoarthritis.    ____________________________________________ Ihor Austin. Benjamin Stain, M.D., ABFM., CAQSM., AME. Primary Care and Sports Medicine [facility]  Adjunct Professor of Family Medicine  [place] [employer] [facility] [employer]  Restaurant manager, fast food

## 2021-12-05 NOTE — Telephone Encounter (Signed)
Benefits Investigation Details received from [employer] Injection: Orthovisc  Medical: Deductible applies. Once the deductible has been met, pt is responsible for a coinsurance. Once the OOP has ben met, the pt is covered at 100%.  PA required: Yes Call to [facility] to initiate the PA. Tracking # [id number]  Pharmacy: Product not covered under the pharmacy plan.   Specialty Pharmacy: [facility]  May fill through: [place] [facility] Copay/Coinsurance: $35 Product Copay:  Administration Coinsurance: 20% Administration Copay:  Deductible: $750 (met: $750) Out of Pocket Max: $2000 (met: $1466.60)

## 2021-12-08 ENCOUNTER — Telehealth: Payer: Self-pay | Admitting: Sports Medicine

## 2021-12-08 NOTE — Telephone Encounter (Signed)
As discussed  she and I will figure this out, we may need to do 2 in 1 week.

## 2021-12-08 NOTE — Telephone Encounter (Signed)
Pt called in this morning to schedule her Orthovisc injections (4). We were not able to get her scheduled this week for the first one, so she stated that she would not be able to do them (at least 3 of them) before she is leaving country for 3 weeks. Any advice on scheduling these?

## 2021-12-10 ENCOUNTER — Ambulatory Visit (INDEPENDENT_AMBULATORY_CARE_PROVIDER_SITE_OTHER): Payer: BC Managed Care – PPO | Admitting: Sports Medicine

## 2021-12-10 ENCOUNTER — Ambulatory Visit (INDEPENDENT_AMBULATORY_CARE_PROVIDER_SITE_OTHER): Payer: BC Managed Care – PPO

## 2021-12-10 DIAGNOSIS — M1712 Unilateral primary osteoarthritis, left knee: Secondary | ICD-10-CM

## 2021-12-10 NOTE — Assessment & Plan Note (Signed)
Orthovisc 1 of 4 into the left knee, return in 1 week for #2 of 4.

## 2021-12-10 NOTE — Progress Notes (Signed)
    Procedures performed today:    Procedure: Real-time Ultrasound Guided injection of the left knee Device: Samsung HS60  Verbal informed consent obtained.  Time-out conducted.  Noted no overlying erythema, induration, or other signs of local infection.  Skin prepped in a sterile fashion.  Local anesthesia: Topical Ethyl chloride.  With sterile technique and under real time ultrasound guidance: Noted trace effusion, 30 mg/2 mL of OrthoVisc (sodium hyaluronate) in a prefilled syringe was injected easily into the knee through a 22-gauge needle. Completed without difficulty  Advised to call if fevers/chills, erythema, induration, drainage, or persistent bleeding.  Images permanently stored and available for review in PACS.  Impression: Technically successful ultrasound guided injection.  Independent interpretation of notes and tests performed by another provider:   None.  Brief History, Exam, Impression, and Recommendations:    Primary osteoarthritis of left knee, status post meniscal debridement Orthovisc 1 of 4 into the left knee, return in 1 week for #2 of 4.    ____________________________________________ Ihor Austin. Benjamin Stain, M.D., ABFM., CAQSM., AME. Primary Care and Sports Medicine [facility]  Adjunct Professor of Family Medicine  [place] [employer] [facility] [employer]  Restaurant manager, fast food

## 2021-12-17 ENCOUNTER — Ambulatory Visit (INDEPENDENT_AMBULATORY_CARE_PROVIDER_SITE_OTHER): Payer: BC Managed Care – PPO

## 2021-12-17 ENCOUNTER — Ambulatory Visit (INDEPENDENT_AMBULATORY_CARE_PROVIDER_SITE_OTHER): Payer: BC Managed Care – PPO | Admitting: Sports Medicine

## 2021-12-17 DIAGNOSIS — M1712 Unilateral primary osteoarthritis, left knee: Secondary | ICD-10-CM | POA: Diagnosis not present

## 2021-12-17 NOTE — Progress Notes (Signed)
    Procedures performed today:    Procedure: Real-time Ultrasound Guided injection of the left knee Device: Samsung HS60  Verbal informed consent obtained.  Time-out conducted.  Noted no overlying erythema, induration, or other signs of local infection.  Skin prepped in a sterile fashion.  Local anesthesia: Topical Ethyl chloride.  With sterile technique and under real time ultrasound guidance: Noted trace effusion, 30 mg/2 mL of OrthoVisc (sodium hyaluronate) in a prefilled syringe was injected easily into the knee through a 22-gauge needle. Completed without difficulty  Advised to call if fevers/chills, erythema, induration, drainage, or persistent bleeding.  Images permanently stored and available for review in PACS.  Impression: Technically successful ultrasound guided injection.  Independent interpretation of notes and tests performed by another provider:   None.  Brief History, Exam, Impression, and Recommendations:    Primary osteoarthritis of left knee, status post meniscal debridement Orthovisc No. 2 of 4 into the left knee, return in 1 week for #3 of 4.    ____________________________________________ Ihor Austin. Benjamin Stain, M.D., ABFM., CAQSM., AME. Primary Care and Sports Medicine [facility]  Adjunct Professor of Family Medicine  [place] [employer] [facility] [employer]  Restaurant manager, fast food

## 2021-12-17 NOTE — Assessment & Plan Note (Signed)
Orthovisc No. 2 of 4 into the left knee, return in 1 week for #3 of 4 

## 2021-12-23 ENCOUNTER — Ambulatory Visit (INDEPENDENT_AMBULATORY_CARE_PROVIDER_SITE_OTHER): Payer: BC Managed Care – PPO | Admitting: Sports Medicine

## 2021-12-23 ENCOUNTER — Ambulatory Visit (INDEPENDENT_AMBULATORY_CARE_PROVIDER_SITE_OTHER): Payer: BC Managed Care – PPO

## 2021-12-23 DIAGNOSIS — M1712 Unilateral primary osteoarthritis, left knee: Secondary | ICD-10-CM

## 2021-12-23 MED ORDER — HYDROCODONE-IBUPROFEN 5-200 MG PO TABS: 1.0000 | ORAL_TABLET | ORAL | 0 refills | Status: DC | PRN

## 2021-12-23 MED ORDER — CHLORTHALIDONE 25 MG PO TABS: 25.0000 mg | ORAL_TABLET | Freq: Every day | ORAL | 3 refills | Status: DC

## 2021-12-23 NOTE — Progress Notes (Signed)
    Procedures performed today:    Procedure: Real-time Ultrasound Guided injection of the left knee Device: Samsung HS60  Verbal informed consent obtained.  Time-out conducted.  Noted no overlying erythema, induration, or other signs of local infection.  Skin prepped in a sterile fashion.  Local anesthesia: Topical Ethyl chloride.  With sterile technique and under real time ultrasound guidance: Noted trace effusion, I initially tried to do the injection with a 1.5 inch 22-gauge needle, we could not reach the joint capsule so we had to switch to a spinal needle, 30 mg/2 mL of OrthoVisc (sodium hyaluronate) in a prefilled syringe was injected easily into the knee through a 22-gauge spinal needle. Completed without difficulty  Advised to call if fevers/chills, erythema, induration, drainage, or persistent bleeding.  Images permanently stored and available for review in PACS.  Impression: Technically successful ultrasound guided injection.  Independent interpretation of notes and tests performed by another provider:   None.  Brief History, Exam, Impression, and Recommendations:    Primary osteoarthritis of left knee, status post meniscal debridement Orthovisc 3 of 4 left knee, she will be coming back on Friday for #4 of 4.   I did initially try a standard 22-gauge 1.5 inch needle but was unable to reach the joint capsule so we switched to a spinal needle, I will not be charging for the procedure because we had to pull the needle out and try again with a different needle. Vicoprofen sent in for her trip.    ____________________________________________ Ihor Austin. Benjamin Stain, M.D., ABFM., CAQSM., AME. Primary Care and Sports Medicine [facility]  Adjunct Professor of Family Medicine  [place] [employer] [facility] [employer]  Restaurant manager, fast food

## 2021-12-23 NOTE — Assessment & Plan Note (Addendum)
Orthovisc 3 of 4 left knee, she will be coming back on Friday for #4 of 4.   I did initially try a standard 22-gauge 1.5 inch needle but was unable to reach the joint capsule so we switched to a spinal needle, I will not be charging for the procedure because we had to pull the needle out and try again with a different needle. Vicoprofen sent in for her trip.

## 2021-12-24 ENCOUNTER — Ambulatory Visit: Payer: BC Managed Care – PPO | Admitting: Sports Medicine

## 2021-12-25 ENCOUNTER — Ambulatory Visit: Payer: BC Managed Care – PPO | Admitting: Sports Medicine

## 2021-12-26 ENCOUNTER — Ambulatory Visit (INDEPENDENT_AMBULATORY_CARE_PROVIDER_SITE_OTHER): Payer: BC Managed Care – PPO | Admitting: Sports Medicine

## 2021-12-26 ENCOUNTER — Ambulatory Visit (INDEPENDENT_AMBULATORY_CARE_PROVIDER_SITE_OTHER): Payer: BC Managed Care – PPO

## 2021-12-26 DIAGNOSIS — M1712 Unilateral primary osteoarthritis, left knee: Secondary | ICD-10-CM

## 2021-12-26 NOTE — Progress Notes (Signed)
    Procedures performed today:    Procedure: Real-time Ultrasound Guided injection of the left knee Device: Samsung HS60  Verbal informed consent obtained.  Time-out conducted.  Noted no overlying erythema, induration, or other signs of local infection.  Skin prepped in a sterile fashion.  Local anesthesia: Topical Ethyl chloride.  With sterile technique and under real time ultrasound guidance: 30 mg/2 mL of OrthoVisc (sodium hyaluronate) in a prefilled syringe was injected easily into the knee through a 22-gauge spinal needle followed by 1 to 2 mL of lidocaine. Completed without difficulty  Advised to call if fevers/chills, erythema, induration, drainage, or persistent bleeding.  Images permanently stored and available for review in PACS.  Impression: Technically successful ultrasound guided injection.  Independent interpretation of notes and tests performed by another provider:   None.  Brief History, Exam, Impression, and Recommendations:    Primary osteoarthritis of left knee, status post meniscal debridement Orthovisc 4 of 4 left knee, return as needed.    ____________________________________________ Ihor Austin. Benjamin Stain, M.D., ABFM., CAQSM., AME. Primary Care and Sports Medicine [facility]  Adjunct Professor of Family Medicine  [place] [employer] [facility] [employer]  Restaurant manager, fast food

## 2021-12-26 NOTE — Assessment & Plan Note (Signed)
Orthovisc 4 of 4 left knee, return as needed. 

## 2022-01-20 ENCOUNTER — Encounter: Payer: Self-pay | Admitting: Medical-Surgical

## 2022-01-20 ENCOUNTER — Ambulatory Visit: Payer: BC Managed Care – PPO | Admitting: Medical-Surgical

## 2022-01-20 VITALS — BP 124/78 | HR 79 | Resp 20 | Ht 66.0 in | Wt 264.8 lb

## 2022-01-20 DIAGNOSIS — R300 Dysuria: Secondary | ICD-10-CM | POA: Diagnosis not present

## 2022-01-20 DIAGNOSIS — N3001 Acute cystitis with hematuria: Secondary | ICD-10-CM

## 2022-01-20 LAB — POCT URINALYSIS DIP (CLINITEK)
Bilirubin, UA: NEGATIVE
Glucose, UA: NEGATIVE mg/dL
Ketones, POC UA: NEGATIVE mg/dL
Nitrite, UA: NEGATIVE
POC PROTEIN,UA: 30 — AB
Spec Grav, UA: >=1.03 — AB (ref 1.010–1.025)
Urobilinogen, UA: 0.2 E.U./dL
pH, UA: 5.5 (ref 5.0–8.0)

## 2022-01-20 MED ORDER — NITROFURANTOIN MONOHYD MACRO 100 MG PO CAPS: 100.0000 mg | ORAL_CAPSULE | Freq: Two times a day (BID) | ORAL | 0 refills | Status: DC

## 2022-01-20 NOTE — Progress Notes (Signed)
   Established Patient Office Visit  Subjective   Patient ID: Mariah Jones, female   DOB: Mar 12, 1961 Age: 61 y.o. MRN: [id number]   Chief Complaint  Patient presents with   Dysuria   HPI Pleasant 61 year old female presenting today with urinary an overseas trip where she visited multiple areas.  As soon as she arrived back home, she developed some dysuria that includes urinary urgency.  When she sits down, the symptoms are worse and she feels as if she has to urinate and have a bowel movement at the same time but she has also noticed burning when she sits.  She is having no urinary frequency but has also felt under the weather for the last 4 days with worsened fatigue.  No nausea/vomiting, fever, or chills.  Has not taken any medications to treat her symptoms.   Objective:    Vitals:   01/20/22 1518  BP: 124/78  Pulse: 79  Resp: 20  Height: 5\' 6"  (1.676 m)  Weight: 264 lb 12.8 oz (120.1 kg)  SpO2: 97%  BMI (Calculated): 42.76    Physical Exam Vitals and nursing note reviewed.  Constitutional:      General: She is not in acute distress.    Appearance: Normal appearance. She is not ill-appearing.  HENT:     Head: Normocephalic and atraumatic.  Cardiovascular:     Rate and Rhythm: Normal rate and regular rhythm.     Pulses: Normal pulses.     Heart sounds: Normal heart sounds.  Pulmonary:     Effort: Pulmonary effort is normal. No respiratory distress.     Breath sounds: Normal breath sounds. No wheezing, rhonchi or rales.  Skin:    General: Skin is warm and dry.  Neurological:     Mental Status: She is alert and oriented to person, place, and time.  Psychiatric:        Mood and Affect: Mood normal.        Behavior: Behavior normal.        Thought Content: Thought content normal.        Judgment: Judgment normal.   No results found for this or any previous visit (from the past 24 hour(s)).     The 10-year ASCVD risk score (Arnett DK, et al., 2019) is: 5.9%    Values used to calculate the score:     Age: 78 years     Sex: Female     Is Non-Hispanic African American: No     Diabetic: No     Tobacco smoker: No     Systolic Blood Pressure: 591 mmHg     Is BP treated: Yes     HDL Cholesterol: 42 mg/dL     Total Cholesterol: 225 mg/dL   Assessment & Plan:   1. Acute cystitis with hematuria POCT urinalysis positive for large leukocytes, 30 mg/dL of protein, large blood, and elevated specific gravity.  Negative for nitrites.  Sending for culture.  We will go ahead and treat empirically with Macrobid 100 mg twice daily.  Recommend increasing daily fluid consumption to no less than 64 ounces per day, more if drinking caffeinated beverages.  Once culture results are available, I will reach back out to her with any recommendations.  Return if symptoms worsen or fail to improve.  ___________________________________________ Clearnce Sorrel, DNP, APRN, FNP-BC Primary Care and [place]

## 2022-01-20 NOTE — Addendum Note (Signed)
Addended by: Beverlee Nims on: 01/20/2022 04:42 PM   Modules accepted: Orders

## 2022-01-22 LAB — URINE CULTURE
MICRO NUMBER:: 13972517
SPECIMEN QUALITY:: ADEQUATE

## 2022-01-28 ENCOUNTER — Ambulatory Visit: Payer: BC Managed Care – PPO | Admitting: Medical-Surgical

## 2022-01-28 ENCOUNTER — Encounter: Payer: Self-pay | Admitting: Medical-Surgical

## 2022-01-28 ENCOUNTER — Ambulatory Visit (INDEPENDENT_AMBULATORY_CARE_PROVIDER_SITE_OTHER): Payer: BC Managed Care – PPO

## 2022-01-28 VITALS — BP 123/79 | HR 87 | Resp 20 | Ht 66.0 in | Wt 254.0 lb

## 2022-01-28 DIAGNOSIS — R300 Dysuria: Secondary | ICD-10-CM | POA: Diagnosis not present

## 2022-01-28 DIAGNOSIS — R3915 Urgency of urination: Secondary | ICD-10-CM

## 2022-01-28 DIAGNOSIS — R310 Gross hematuria: Secondary | ICD-10-CM

## 2022-01-28 DIAGNOSIS — R109 Unspecified abdominal pain: Secondary | ICD-10-CM

## 2022-01-28 NOTE — Progress Notes (Signed)
Established Patient Office Visit  Subjective   Patient ID: Mariah Jones, female   DOB: 11/13/1960 Age: 61 y.o. MRN: [id number]   Chief Complaint  Patient presents with   Follow-up   Dysuria   Hematuria    HPI Pleasant 61 year old female presenting today for reevaluation after being seen on 9/26 for dysuria.  At the time, her symptoms including burning with urination, and mild hematuria.  She had some frequency and urgency as well.  Her urinalysis showed large amount of leukocytes, large blood, small amount of protein, elevated specific gravity, and negative nitrites.  She was started empirically on Macrobid 100 mg twice daily for 5 days.  Her urine was sent off for culture which showed less than 10,000 colony-forming units of a gram-negative organism but no findings to indicate further treatment with antibiotics and no speciation of the bacteria.  Today, she presents reporting that she completed the antibiotics as prescribed with an improvement in her symptoms.  She reports that her symptoms did completely resolve but after taking her last dose of the antibiotic, her symptoms returned.  Today she notes that she continues to have burning after she has completed voiding, urinary urgency, nausea, lethargy, and hematuria.  No visible blood in the toilet however she does see pink on the tissue when she wipes.  She is not having any fevers, chills, vaginal discharge, vaginal itching, or unusual odor.  She is not currently sexually active and has not been in quite a while.  Notes that it would be impossible for her to have a sexually transmitted infection at this point.  No history of kidney stones.  Reports that she was once told that her kidneys did not function as well is that should and is very concerned with her current symptoms.   Objective:    Vitals:   01/28/22 1540  BP: 123/79  Pulse: 87  Resp: 20  Height: 5\' 6"  (1.676 m)  Weight: 254 lb (115.2 kg)  SpO2: 97%  BMI (Calculated): 41.02     Physical Exam Vitals and nursing note reviewed.  Constitutional:      General: She is not in acute distress.    Appearance: Normal appearance. She is obese. She is not ill-appearing.  HENT:     Head: Normocephalic and atraumatic.  Cardiovascular:     Rate and Rhythm: Normal rate and regular rhythm.     Pulses: Normal pulses.     Heart sounds: Normal heart sounds.  Pulmonary:     Effort: Pulmonary effort is normal. No respiratory distress.     Breath sounds: Normal breath sounds. No wheezing, rhonchi or rales.  Abdominal:     General: There is no distension.     Palpations: Abdomen is soft.     Tenderness: There is no abdominal tenderness. There is left CVA tenderness. There is no right CVA tenderness or guarding.  Skin:    General: Skin is warm and dry.  Neurological:     Mental Status: She is alert and oriented to person, place, and time.  Psychiatric:        Mood and Affect: Mood normal.        Behavior: Behavior normal.        Thought Content: Thought content normal.        Judgment: Judgment normal.      No results found for this or any previous visit (from the past 24 hour(s)).     The 10-year ASCVD risk score (Arnett DK, et al.,  2019) is: 5.8%   Values used to calculate the score:     Age: 75 years     Sex: Female     Is Non-Hispanic African American: No     Diabetic: No     Tobacco smoker: No     Systolic Blood Pressure: 387 mmHg     Is BP treated: Yes     HDL Cholesterol: 42 mg/dL     Total Cholesterol: 225 mg/dL   Assessment & Plan:   1. Dysuria 2. Gross hematuria 3. Flank pain Unclear etiology.  Sending urine for urinalysis with reflex microscopic as well as urine culture.  Stat wet prep to evaluate for bacterial vaginosis or yeast that may be contributing.  Consider possible kidney stone that has progressed from the ureter into the bladder and now the urethra.  Ordering CT renal stone study with left CVA/flank pain.  Checking blood counts and  metabolic panel for further investigation. - Urinalysis, Routine w reflex microscopic - WET PREP FOR TRICH, YEAST, CLUE - Urine Culture - CT RENAL STONE STUDY; Future - CBC with Differential - COMPLETE METABOLIC PANEL WITH GFR  Return if symptoms worsen or fail to improve.  ___________________________________________ Clearnce Sorrel, DNP, APRN, FNP-BC Primary Care and [place]

## 2022-01-29 LAB — WET PREP FOR TRICH, YEAST, CLUE
MICRO NUMBER:: 14006957
Specimen Quality: ADEQUATE

## 2022-01-29 MED ORDER — LEVOFLOXACIN 750 MG PO TABS: 750.0000 mg | ORAL_TABLET | Freq: Every day | ORAL | 0 refills | Status: DC

## 2022-01-29 NOTE — Addendum Note (Signed)
Addended bySamuel Bouche on: 01/29/2022 12:57 PM   Modules accepted: Orders

## 2022-01-30 ENCOUNTER — Other Ambulatory Visit: Payer: Self-pay | Admitting: Medical-Surgical

## 2022-01-30 MED ORDER — LEVOFLOXACIN 750 MG PO TABS: 750.0000 mg | ORAL_TABLET | Freq: Every day | ORAL | 0 refills | Status: DC

## 2022-01-31 LAB — COMPLETE METABOLIC PANEL WITH GFR
AG Ratio: 1.5 (calc) (ref 1.0–2.5)
ALT: 27 U/L (ref 6–29)
AST: 27 U/L (ref 10–35)
Albumin: 4.1 g/dL (ref 3.6–5.1)
Alkaline phosphatase (APISO): 64 U/L (ref 37–153)
BUN: 19 mg/dL (ref 7–25)
CO2: 33 mmol/L — ABNORMAL HIGH (ref 20–32)
Calcium: 9.6 mg/dL (ref 8.6–10.4)
Chloride: 98 mmol/L (ref 98–110)
Creat: 0.86 mg/dL (ref 0.50–1.05)
Globulin: 2.8 g/dL (calc) (ref 1.9–3.7)
Glucose, Bld: 147 mg/dL — ABNORMAL HIGH (ref 65–99)
Potassium: 3.2 mmol/L — ABNORMAL LOW (ref 3.5–5.3)
Sodium: 139 mmol/L (ref 135–146)
Total Bilirubin: 1.1 mg/dL (ref 0.2–1.2)
Total Protein: 6.9 g/dL (ref 6.1–8.1)
eGFR: 77 mL/min/{1.73_m2} (ref >=60)

## 2022-01-31 LAB — CBC WITH DIFFERENTIAL/PLATELET
Absolute Monocytes: 814 cells/uL (ref 200–950)
Basophils Absolute: 104 cells/uL (ref 0–200)
Basophils Relative: 0.7 %
Eosinophils Absolute: 385 cells/uL (ref 15–500)
Eosinophils Relative: 2.6 %
HCT: 42.7 % (ref 35.0–45.0)
Hemoglobin: 14.2 g/dL (ref 11.7–15.5)
Lymphs Abs: 3078 cells/uL (ref 850–3900)
MCH: 28 pg (ref 27.0–33.0)
MCHC: 33.3 g/dL (ref 32.0–36.0)
MCV: 84.1 fL (ref 80.0–100.0)
MPV: 10.4 fL (ref 7.5–12.5)
Monocytes Relative: 5.5 %
Neutro Abs: 10419 cells/uL — ABNORMAL HIGH (ref 1500–7800)
Neutrophils Relative %: 70.4 %
Platelets: 345 10*3/uL (ref 140–400)
RBC: 5.08 10*6/uL (ref 3.80–5.10)
RDW: 13.9 % (ref 11.0–15.0)
Total Lymphocyte: 20.8 %
WBC: 14.8 10*3/uL — ABNORMAL HIGH (ref 3.8–10.8)

## 2022-01-31 LAB — URINALYSIS, ROUTINE W REFLEX MICROSCOPIC
Bilirubin Urine: NEGATIVE
Glucose, UA: NEGATIVE
Hyaline Cast: NONE SEEN /LPF
Ketones, ur: NEGATIVE
Nitrite: NEGATIVE
RBC / HPF: >=60 /HPF — AB (ref 0–2)
Specific Gravity, Urine: 1.023 (ref 1.001–1.035)
WBC, UA: >=60 /HPF — AB (ref 0–5)
pH: 6 (ref 5.0–8.0)

## 2022-01-31 LAB — URINE CULTURE
MICRO NUMBER:: 14009117
SPECIMEN QUALITY:: ADEQUATE

## 2022-01-31 LAB — MICROSCOPIC MESSAGE

## 2022-02-09 ENCOUNTER — Encounter: Payer: Self-pay | Admitting: Medical-Surgical

## 2022-02-09 ENCOUNTER — Ambulatory Visit (INDEPENDENT_AMBULATORY_CARE_PROVIDER_SITE_OTHER): Payer: BC Managed Care – PPO | Admitting: Medical-Surgical

## 2022-02-09 VITALS — BP 127/82 | HR 73 | Resp 20 | Ht 66.0 in | Wt 260.6 lb

## 2022-02-09 DIAGNOSIS — T368X5A Adverse effect of other systemic antibiotics, initial encounter: Secondary | ICD-10-CM

## 2022-02-09 DIAGNOSIS — M25561 Pain in right knee: Secondary | ICD-10-CM

## 2022-02-09 DIAGNOSIS — M25512 Pain in left shoulder: Secondary | ICD-10-CM

## 2022-02-09 DIAGNOSIS — T50905A Adverse effect of unspecified drugs, medicaments and biological substances, initial encounter: Secondary | ICD-10-CM | POA: Diagnosis not present

## 2022-02-09 DIAGNOSIS — M79604 Pain in right leg: Secondary | ICD-10-CM | POA: Diagnosis not present

## 2022-02-09 DIAGNOSIS — M25511 Pain in right shoulder: Secondary | ICD-10-CM

## 2022-02-09 DIAGNOSIS — M79642 Pain in left hand: Secondary | ICD-10-CM

## 2022-02-09 DIAGNOSIS — M25562 Pain in left knee: Secondary | ICD-10-CM | POA: Diagnosis not present

## 2022-02-09 DIAGNOSIS — M79641 Pain in right hand: Secondary | ICD-10-CM | POA: Diagnosis not present

## 2022-02-09 MED ORDER — CELECOXIB 100 MG PO CAPS: 100.0000 mg | ORAL_CAPSULE | Freq: Two times a day (BID) | ORAL | 1 refills | Status: DC

## 2022-02-09 NOTE — Progress Notes (Signed)
Established Patient Office Visit  Subjective   Patient ID: Mariah Jones, female   DOB: 08/22/1960 Age: 61 y.o. MRN: [id number]   Chief Complaint  Patient presents with   Medication Problem    HPI Pleasant 61 year old female presenting today with complaints of bilateral lower extremity pain that affects both knees as well as severe calf pain in the right leg.  Also has pain in her bilateral hands and shoulders.  This all started about 6 days ago and has progressively worsened until she was unable to stand and walk without her husband's help yesterday.  Today she is ambulating with the use of a cane although she did note that her symptoms have gotten better over the last 24 to 48 hours.  She feels that this may be a reaction to the Levaquin that was started to treat her multidrug-resistant E. coli UTI.  She started the Levaquin the Friday before and her symptoms came around on Tuesday.  She stopped taking the antibiotic yesterday and feels that the improvement in her symptoms is directly related to that.  Also notes that she has glaucoma and her pressures are normally 14-15, stable over the last few years.  Today she went to the eye doctor and her pressures are above 20 which he feels is related to the medication.  Of note, her urinary symptoms and hematuria have fully resolved with no worsening in symptoms after stopping the medication yesterday.   Objective:    Vitals:   02/09/22 1607  BP: 127/82  Pulse: 73  Resp: 20  Height: 5\' 6"  (1.676 m)  Weight: 260 lb 9.6 oz (118.2 kg)  SpO2: 98%  BMI (Calculated): 42.08    Physical Exam Vitals and nursing note reviewed.  Constitutional:      General: She is not in acute distress.    Appearance: Normal appearance. She is obese. She is not ill-appearing.  HENT:     Head: Normocephalic and atraumatic.  Cardiovascular:     Rate and Rhythm: Normal rate and regular rhythm.     Pulses: Normal pulses.     Heart sounds: Normal heart sounds.   Pulmonary:     Effort: Pulmonary effort is normal. No respiratory distress.     Breath sounds: Normal breath sounds. No wheezing, rhonchi or rales.  Skin:    General: Skin is warm and dry.  Neurological:     Mental Status: She is alert and oriented to person, place, and time.     Motor: Weakness (Bilateral lower extremity) present.     Gait: Gait abnormal (Ambulating with cane, halting gait).  Psychiatric:        Mood and Affect: Mood normal.        Behavior: Behavior normal.        Thought Content: Thought content normal.        Judgment: Judgment normal.   No results found for this or any previous visit (from the past 24 hour(s)).     The 10-year ASCVD risk score (Arnett DK, et al., 2019) is: 6.2%   Values used to calculate the score:     Age: 60 years     Sex: Female     Is Non-Hispanic African American: No     Diabetic: No     Tobacco smoker: No     Systolic Blood Pressure: 127 mmHg     Is BP treated: Yes     HDL Cholesterol: 42 mg/dL     Total Cholesterol: 225 mg/dL  Assessment & Plan:   1. Adverse effect of drug, initial encounter Symptom onset suspicious for medication reaction however this is not a typical presentation.  Improvement in symptoms does correlate with stopping the medication yesterday so we will add this to her list of allergies and intolerances.  Since her urinary tract symptoms have fully resolved, no further antibiotics are indicated at this time however advised her to monitor closely for recurrence of this.  In the meantime, she does appear to have been taking too much ibuprofen over the last several days so recommend stopping ibuprofen and start Celebrex 100-200 mg twice daily as needed with no additional NSAIDs.  Patient verbalized understanding is agreeable to the plan.  Return if symptoms worsen or fail to improve. ___________________________________________ Clearnce Sorrel, DNP, APRN, FNP-BC Primary Care and [place]

## 2022-02-20 ENCOUNTER — Ambulatory Visit: Payer: BC Managed Care – PPO | Admitting: Sports Medicine

## 2022-02-20 DIAGNOSIS — M2550 Pain in unspecified joint: Secondary | ICD-10-CM

## 2022-02-20 HISTORY — DX: Pain in unspecified joint: M25.50

## 2022-02-20 HISTORY — DX: Morbid (severe) obesity due to excess calories: E66.01

## 2022-02-20 MED ORDER — PREDNISONE 50 MG PO TABS: ORAL_TABLET | ORAL | 0 refills | Status: DC

## 2022-02-20 MED ORDER — WEGOVY 0.25 MG/0.5ML ~~LOC~~ SOAJ: 0.2500 mg | SUBCUTANEOUS | 0 refills | Status: DC

## 2022-02-20 MED ORDER — HYDROCODONE-IBUPROFEN 5-200 MG PO TABS: 1.0000 | ORAL_TABLET | Freq: Four times a day (QID) | ORAL | 0 refills | Status: DC | PRN

## 2022-02-20 NOTE — Progress Notes (Signed)
    Procedures performed today:    None.  Independent interpretation of notes and tests performed by another provider:   None.  Brief History, Exam, Impression, and Recommendations:    Polyarthralgia This is a pleasant 61 year old female, she was diagnosed with acute cystitis by her PCP couple weeks ago, multidrug-resistant E. coli, but with excellent sensitivity to Levaquin.  After several days on Levaquin she developed widespread aches and pains, knees, shoulders, arms, back. I think this is more systemic inflammatory response to the underlying infection rather than an adverse effect from the antibiotic itself. She will hold on Celebrex and will do 5 days of prednisone, Vicoprofen for 5 days. After that she can restart her Celebrex. As she has had widespread aches and pains I would like a full rheumatoid work-up. I do however believe that the underlying cause of her arthralgias is degenerative rather than autoimmune.  Update: Uric acid levels are quite high, we can try allopurinol to see if this helps to decrease the aches and pains.  Adding allopurinol 300 mg daily, we can recheck this in 4 weeks with a goal of less than 5, if less than 5 and widespread aches and pains have not changed then we will stop allopurinol.  Morbid obesity ([place]) Obesity with multiple comorbidities, we really need to get a good grip on this as it is the underlying cause of the majority of her problems. She will be part of multidisciplinary weight loss program with an exercise prescription, calorie counting, I am going to go and call in [place], if unable to get this approved we will use compounded semaglutide. Once we get this stabilized and she can follow this up with her PCP.    ____________________________________________ Gwen Her. Dianah Field, M.D., ABFM., CAQSM., AME. Primary Care and Sports Medicine [place] [facility]  Adjunct Professor of [place] [employer] of Medicine  Risk manager

## 2022-02-20 NOTE — Assessment & Plan Note (Signed)
This is a pleasant 61 year old female, she was diagnosed with acute cystitis by her PCP couple weeks ago, multidrug-resistant E. coli, but with excellent sensitivity to Levaquin.  After several days on Levaquin she developed widespread aches and pains, knees, shoulders, arms, back. I think this is more systemic inflammatory response to the underlying infection rather than an adverse effect from the antibiotic itself. She will hold on Celebrex and will do 5 days of prednisone, Vicoprofen for 5 days. After that she can restart her Celebrex. As she has had widespread aches and pains I would like a full rheumatoid work-up. I do however believe that the underlying cause of her arthralgias is degenerative rather than autoimmune.  Update: Uric acid levels are quite high, we can try allopurinol to see if this helps to decrease the aches and pains.  Adding allopurinol 300 mg daily, we can recheck this in 4 weeks with a goal of less than 5, if less than 5 and widespread aches and pains have not changed then we will stop allopurinol.

## 2022-02-20 NOTE — Assessment & Plan Note (Signed)
Obesity with multiple comorbidities, we really need to get a good grip on this as it is the underlying cause of the majority of her problems. She will be part of multidisciplinary weight loss program with an exercise prescription, calorie counting, I am going to go and call in Mariah Jones, if unable to get this approved we will use compounded semaglutide. Once we get this stabilized and she can follow this up with her PCP.

## 2022-02-23 MED ORDER — ALLOPURINOL 300 MG PO TABS: 300.0000 mg | ORAL_TABLET | Freq: Every day | ORAL | 6 refills | Status: DC

## 2022-02-23 NOTE — Addendum Note (Signed)
Addended by: Silverio Decamp on: 02/23/2022 09:06 AM   Modules accepted: Orders

## 2022-02-27 LAB — CBC WITH DIFFERENTIAL/PLATELET
Absolute Monocytes: 567 cells/uL (ref 200–950)
Basophils Absolute: 54 cells/uL (ref 0–200)
Basophils Relative: 0.5 %
Eosinophils Absolute: 257 cells/uL (ref 15–500)
Eosinophils Relative: 2.4 %
HCT: 39.2 % (ref 35.0–45.0)
Hemoglobin: 13.3 g/dL (ref 11.7–15.5)
Lymphs Abs: 3082 cells/uL (ref 850–3900)
MCH: 27.7 pg (ref 27.0–33.0)
MCHC: 33.9 g/dL (ref 32.0–36.0)
MCV: 81.7 fL (ref 80.0–100.0)
MPV: 11.2 fL (ref 7.5–12.5)
Monocytes Relative: 5.3 %
Neutro Abs: 6741 cells/uL (ref 1500–7800)
Neutrophils Relative %: 63 %
Platelets: 283 10*3/uL (ref 140–400)
RBC: 4.8 10*6/uL (ref 3.80–5.10)
RDW: 13.6 % (ref 11.0–15.0)
Total Lymphocyte: 28.8 %
WBC: 10.7 10*3/uL (ref 3.8–10.8)

## 2022-02-27 LAB — LUPUS(12) PANEL
Anti Nuclear Antibody (ANA): POSITIVE — AB
C3 Complement: 155 mg/dL (ref 83–193)
C4 Complement: 39 mg/dL (ref 15–57)
ENA SM Ab Ser-aCnc: <1 AI
Rheumatoid fact SerPl-aCnc: <14 IU/mL (ref <14)
Ribosomal P Protein Ab: <1 AI
SM/RNP: <1 AI
SSA (Ro) (ENA) Antibody, IgG: <1 AI
SSB (La) (ENA) Antibody, IgG: <1 AI
Scleroderma (Scl-70) (ENA) Antibody, IgG: <1 AI
Thyroperoxidase Ab SerPl-aCnc: 2 IU/mL (ref <9)
ds DNA Ab: <1 IU/mL

## 2022-02-27 LAB — COMPREHENSIVE METABOLIC PANEL
AG Ratio: 1.7 (calc) (ref 1.0–2.5)
ALT: 18 U/L (ref 6–29)
AST: 18 U/L (ref 10–35)
Albumin: 4.1 g/dL (ref 3.6–5.1)
Alkaline phosphatase (APISO): 57 U/L (ref 37–153)
BUN: 16 mg/dL (ref 7–25)
CO2: 30 mmol/L (ref 20–32)
Calcium: 8.9 mg/dL (ref 8.6–10.4)
Chloride: 101 mmol/L (ref 98–110)
Creat: 0.81 mg/dL (ref 0.50–1.05)
Globulin: 2.4 g/dL (calc) (ref 1.9–3.7)
Glucose, Bld: 165 mg/dL — ABNORMAL HIGH (ref 65–99)
Potassium: 3.5 mmol/L (ref 3.5–5.3)
Sodium: 141 mmol/L (ref 135–146)
Total Bilirubin: 0.9 mg/dL (ref 0.2–1.2)
Total Protein: 6.5 g/dL (ref 6.1–8.1)

## 2022-02-27 LAB — SEDIMENTATION RATE: Sed Rate: 17 mm/h (ref 0–30)

## 2022-02-27 LAB — ANTI-NUCLEAR AB-TITER (ANA TITER)
ANA TITER: 1:40 {titer} — ABNORMAL HIGH
ANA Titer 1: 1:40 {titer} — ABNORMAL HIGH

## 2022-02-27 LAB — RHEUMATOID FACTOR (IGA, IGG, IGM)
Rheumatoid Factor (IgA): <5 U (ref <=6)
Rheumatoid Factor (IgG): <5 U (ref <=6)
Rheumatoid Factor (IgM): <5 U (ref <=6)

## 2022-02-27 LAB — URIC ACID: Uric Acid, Serum: 8.5 mg/dL — ABNORMAL HIGH (ref 2.5–7.0)

## 2022-02-27 LAB — CK: Total CK: 61 U/L (ref 29–143)

## 2022-02-27 LAB — CYCLIC CITRUL PEPTIDE ANTIBODY, IGG: Cyclic Citrullin Peptide Ab: <16 UNITS

## 2022-03-02 NOTE — Progress Notes (Signed)
Attempted call to patient. Left voice mail requesting  a return call. Approval already in place for Orthovisc.

## 2022-03-10 ENCOUNTER — Telehealth: Payer: Self-pay | Admitting: Sports Medicine

## 2022-03-10 NOTE — Telephone Encounter (Signed)
I was not aware of this on review of her chart, but I guess lets go ahead and get approval for the right knee as well.

## 2022-03-10 NOTE — Telephone Encounter (Signed)
[employer] paperwork faxed to [employer] at [phone] Request is for Orthovisc Pt's insurance prefers Orthovisc Fax confirmation receipt received  

## 2022-03-10 NOTE — Telephone Encounter (Signed)
Patient called about orthovisc in right knee she has called twice and stated she needs a call back in regard to this

## 2022-03-11 NOTE — Telephone Encounter (Signed)
Benefits Investigation Details received from [employer] Injection: Orthovisc  Medical: Deductible does not apply. Once the OOP has been met, pt is covered at 100%. Only one copay applies per DOS.  PA required: Yes PA for the drug is required and on file. PA ref# [id number] valid 12/05/21-06/02/22 Pharmacy: Currently not available.   Specialty Pharmacy: [facility]  May fill through: [facility] and [place] Copay/Coinsurance: $35 Product Copay:  Administration Coinsurance:  Administration Copay:  Deductible: $2000 (met: $unknown) Out of Pocket Max: $4000 (met: $unknown)    Pt aware of copay and will call the front desk to get scheduled.

## 2022-03-24 ENCOUNTER — Ambulatory Visit: Payer: BC Managed Care – PPO | Admitting: Sports Medicine

## 2022-03-25 ENCOUNTER — Telehealth: Payer: Self-pay

## 2022-03-25 NOTE — Telephone Encounter (Addendum)
Initiated Prior authorization JIR:CVELFY 0.25MG /0.5ML auto-injectors Via: Covermymeds Case/Key:[id number] Status: cancelled  as of 03/25/22 Reason:cancelled by provider medication therapy changed. Notified Pt via: Mychart   Initiated Prior authorization BOF:BPZWCHEN 2.5 MG/0.5ML Via: Covermymeds Case/Key:[id number] Status: approved  as of 04/06/22 Reason:this request is approved from 04/06/22-04/07/23 Notified Pt via: Mychart

## 2022-03-30 ENCOUNTER — Ambulatory Visit: Payer: Self-pay

## 2022-03-30 ENCOUNTER — Encounter: Payer: Self-pay | Admitting: Sports Medicine

## 2022-03-30 ENCOUNTER — Ambulatory Visit (INDEPENDENT_AMBULATORY_CARE_PROVIDER_SITE_OTHER): Payer: BC Managed Care – PPO | Admitting: Sports Medicine

## 2022-03-30 ENCOUNTER — Ambulatory Visit (INDEPENDENT_AMBULATORY_CARE_PROVIDER_SITE_OTHER): Payer: BC Managed Care – PPO

## 2022-03-30 VITALS — BP 139/78 | HR 91 | Wt 260.0 lb

## 2022-03-30 DIAGNOSIS — M1389 Other specified arthritis, multiple sites: Secondary | ICD-10-CM

## 2022-03-30 DIAGNOSIS — M2550 Pain in unspecified joint: Secondary | ICD-10-CM | POA: Diagnosis not present

## 2022-03-30 DIAGNOSIS — M1811 Unilateral primary osteoarthritis of first carpometacarpal joint, right hand: Secondary | ICD-10-CM

## 2022-03-30 DIAGNOSIS — M170 Bilateral primary osteoarthritis of knee: Secondary | ICD-10-CM | POA: Diagnosis not present

## 2022-03-30 DIAGNOSIS — M180 Bilateral primary osteoarthritis of first carpometacarpal joints: Secondary | ICD-10-CM

## 2022-03-30 MED ORDER — TRIAMCINOLONE ACETONIDE 40 MG/ML IJ SUSP
80.0000 mg | Freq: Once | INTRAMUSCULAR | Status: AC
  Administered 2022-03-30: 80 mg via INTRAMUSCULAR

## 2022-03-30 MED ORDER — HYALURONAN 30 MG/2ML IX SOSY
30.0000 mg | PREFILLED_SYRINGE | Freq: Once | INTRA_ARTICULAR | Status: AC
  Administered 2022-03-30: 30 mg via INTRA_ARTICULAR

## 2022-03-30 NOTE — Progress Notes (Addendum)
    Procedures performed today:    Procedure: Real-time Ultrasound Guided injection of the right knee Device: Samsung HS60  Verbal informed consent obtained.  Time-out conducted.  Noted no overlying erythema, induration, or other signs of local infection.  Skin prepped in a sterile fashion.  Local anesthesia: Topical Ethyl chloride.  With sterile technique and under real time ultrasound guidance: Noted trace effusion, 30 mg/2 mL of OrthoVisc (sodium hyaluronate) in a prefilled syringe was injected easily into the knee through a 22-gauge SPINAL needle. Completed without difficulty  Advised to call if fevers/chills, erythema, induration, drainage, or persistent bleeding.  Images permanently stored and available for review in PACS.  Impression: Technically successful ultrasound guided injection.  Procedure: Real-time Ultrasound Guided injection of the left first carpometacarpal joint Device: Samsung HS60  Verbal informed consent obtained.  Time-out conducted.  Noted no overlying erythema, induration, or other signs of local infection.  Skin prepped in a sterile fashion.  Local anesthesia: Topical Ethyl chloride.  With sterile technique and under real time ultrasound guidance: Noted arthritic changes, 1/2 cc lidocaine, 1/2 cc kenalog  40 injected easily Completed without difficulty  Advised to call if fevers/chills, erythema, induration, drainage, or persistent bleeding.  Images permanently stored and available for review in PACS.  Impression: Technically successful ultrasound guided injection.  Procedure: Real-time Ultrasound Guided injection of the right first carpometacarpal joint Device: Samsung HS60  Verbal informed consent obtained.  Time-out conducted.  Noted no overlying erythema, induration, or other signs of local infection.  Skin prepped in a sterile fashion.  Local anesthesia: Topical Ethyl chloride.  With sterile technique and under real time ultrasound guidance: Noted  arthritic changes, 1/2 cc lidocaine, 1/2 cc kenalog  40 injected easily Completed without difficulty  Advised to call if fevers/chills, erythema, induration, drainage, or persistent bleeding.  Images permanently stored and available for review in PACS.  Impression: Technically successful ultrasound guided injection.  Independent interpretation of notes and tests performed by another provider:   None.  Brief History, Exam, Impression, and Recommendations:    Primary osteoarthritis of both knees status post meniscal debridement on the left We finished Orthovisc in the left knee back in September of this year, left knee still has significant discomfort, we started Orthovisc on the right knee today, return in 1 week for Orthovisc No. 2 of 4 right knee. SPINAL needle used.  Polyarthralgia with hyperuricemia Unfortunately continues to have discomfort and spite of steroids, Celebrex , rheumatoid workup was negative with the exception of elevated uric acid levels, she has been doing allopurinol  300 mg daily with no improvement in her widespread discomfort, I would like to recheck her uric acid levels, and if still elevated we will bump her allopurinol  up, if they are normal then that tells us  that her pain is not from hyperuricemia and we will stop her allopurinol .  Update: Uric acid levels have improved down into the normal range but due to no improvement in pain we can go ahead and discontinue allopurinol .    ____________________________________________ Debby PARAS. Curtis, M.D., ABFM., CAQSM., AME. Primary Care and Sports Medicine [place] [facility]  Adjunct Professor of [facility] Medicine  [employer]  Restaurant Manager, [employer]

## 2022-03-30 NOTE — Assessment & Plan Note (Addendum)
We finished Orthovisc in the left knee back in September of this year, left knee still has significant discomfort, we started Orthovisc on the right knee today, return in 1 week for Orthovisc No. 2 of 4 right knee. SPINAL needle used.

## 2022-03-30 NOTE — Assessment & Plan Note (Signed)
Unfortunately continues to have discomfort and spite of steroids, Celebrex, rheumatoid workup was negative with the exception of elevated uric acid levels, she has been doing allopurinol 300 mg daily with no improvement in her widespread discomfort, I would like to recheck her uric acid levels, and if still elevated we will bump her allopurinol up, if they are normal then that tells [place] that her pain is not from hyperuricemia and we will stop her allopurinol.  Update: Uric acid levels have improved down into the normal range but due to no improvement in pain we can go ahead and discontinue allopurinol.

## 2022-03-31 ENCOUNTER — Encounter: Payer: Self-pay | Admitting: Sports Medicine

## 2022-03-31 LAB — URIC ACID: Uric Acid, Serum: 6.1 mg/dL (ref 2.5–7.0)

## 2022-03-31 NOTE — Addendum Note (Signed)
Addended by: Monica Becton on: 03/31/2022 09:35 AM   Modules accepted: Orders

## 2022-04-06 ENCOUNTER — Ambulatory Visit: Payer: BC Managed Care – PPO | Admitting: Sports Medicine

## 2022-04-06 ENCOUNTER — Ambulatory Visit (INDEPENDENT_AMBULATORY_CARE_PROVIDER_SITE_OTHER): Payer: BC Managed Care – PPO

## 2022-04-06 DIAGNOSIS — M170 Bilateral primary osteoarthritis of knee: Secondary | ICD-10-CM

## 2022-04-06 DIAGNOSIS — E119 Type 2 diabetes mellitus without complications: Secondary | ICD-10-CM | POA: Diagnosis not present

## 2022-04-06 HISTORY — DX: Type 2 diabetes mellitus without complications: E11.9

## 2022-04-06 MED ORDER — MOUNJARO 2.5 MG/0.5ML ~~LOC~~ SOAJ: 2.5000 mg | SUBCUTANEOUS | 0 refills | Status: DC

## 2022-04-06 MED ORDER — METFORMIN HCL ER 500 MG PO TB24: 500.0000 mg | ORAL_TABLET | Freq: Every day | ORAL | 3 refills | Status: DC

## 2022-04-06 MED ORDER — HYALURONAN 30 MG/2ML IX SOSY
30.0000 mg | PREFILLED_SYRINGE | Freq: Once | INTRA_ARTICULAR | Status: AC
  Administered 2022-04-06: 30 mg via INTRA_ARTICULAR

## 2022-04-06 NOTE — Addendum Note (Signed)
Addended by: Monica Becton on: 04/06/2022 09:13 AM   Modules accepted: Orders, Level of Service

## 2022-04-06 NOTE — Progress Notes (Addendum)
    Procedures performed today:    Procedure: Real-time Ultrasound Guided injection of the right knee Device: Samsung HS60  Verbal informed consent obtained.  Time-out conducted.  Noted no overlying erythema, induration, or other signs of local infection.  Skin prepped in a sterile fashion.  Local anesthesia: Topical Ethyl chloride.  With sterile technique and under real time ultrasound guidance: Noted trace effusion, 30 mg/2 mL of OrthoVisc (sodium hyaluronate) in a prefilled syringe was injected easily into the knee through a 22-gauge SPINAL needle, I then chased this with 1 mL of lidocaine to clear the needle. Completed without difficulty  Advised to call if fevers/chills, erythema, induration, drainage, or persistent bleeding.  Images permanently stored and available for review in PACS.  Impression: Technically successful ultrasound guided injection.  Independent interpretation of notes and tests performed by another provider:   None.  Brief History, Exam, Impression, and Recommendations:    Primary osteoarthritis of both knees status post meniscal debridement on the left Orthovisc 2 of 4 right knee we did use a spinal needle. Left knee still has significant discomfort, Orthovisc completed back in September of this year.  Controlled type 2 diabetes mellitus without complication, without long-term current use of insulin (HCC) Mariah Jones had a few blood sugars that were elevated, we checked her A1c today and it was 7.1% confirming the diagnosis of diabetes. She has had difficulty getting Wegovy, I think with this new diagnosis we will have a better time getting her GLP-1, adding metformin XR and Mounjaro. Follow-up with PCP for additional screenings.    ____________________________________________ Ihor Austin. Benjamin Stain, M.D., ABFM., CAQSM., AME. Primary Care and Sports Medicine [place] [facility]  Adjunct Professor of Family Medicine  [place] [employer] of Medicine  Restaurant manager, fast food

## 2022-04-06 NOTE — Assessment & Plan Note (Signed)
Orthovisc 2 of 4 right knee we did use a spinal needle. Left knee still has significant discomfort, Orthovisc completed back in September of this year.

## 2022-04-06 NOTE — Assessment & Plan Note (Signed)
Mariah Jones had a few blood sugars that were elevated, we checked her A1c today and it was 7.1% confirming the diagnosis of diabetes. She has had difficulty getting Wegovy, I think with this new diagnosis we will have a better time getting her GLP-1, adding metformin XR and Mounjaro. Follow-up with PCP for additional screenings.

## 2022-04-09 ENCOUNTER — Telehealth: Payer: Self-pay

## 2022-04-09 LAB — POCT GLYCOSYLATED HEMOGLOBIN (HGB A1C): Hemoglobin A1C: 7.1 % — AB (ref 4.0–5.6)

## 2022-04-09 NOTE — Addendum Note (Signed)
Addended by: Carren Rang A on: 04/09/2022 01:56 PM   Modules accepted: Orders

## 2022-04-09 NOTE — Telephone Encounter (Signed)
Patient states that she was informed by insurance that the reason Greggory Keen was denied because they need patient's most recent A1C and glucose.

## 2022-04-13 ENCOUNTER — Ambulatory Visit (INDEPENDENT_AMBULATORY_CARE_PROVIDER_SITE_OTHER): Payer: BC Managed Care – PPO | Admitting: Sports Medicine

## 2022-04-13 ENCOUNTER — Ambulatory Visit (INDEPENDENT_AMBULATORY_CARE_PROVIDER_SITE_OTHER): Payer: BC Managed Care – PPO

## 2022-04-13 DIAGNOSIS — M170 Bilateral primary osteoarthritis of knee: Secondary | ICD-10-CM | POA: Diagnosis not present

## 2022-04-13 MED ORDER — HYALURONAN 30 MG/2ML IX SOSY
30.0000 mg | PREFILLED_SYRINGE | Freq: Once | INTRA_ARTICULAR | Status: AC
  Administered 2022-04-13: 30 mg via INTRA_ARTICULAR

## 2022-04-13 NOTE — Assessment & Plan Note (Signed)
Orthovisc 3 of 4 right knee, we did use a spinal needle.

## 2022-04-13 NOTE — Progress Notes (Signed)
    Procedures performed today:    Procedure: Real-time Ultrasound Guided injection of the right knee Device: Samsung HS60  Verbal informed consent obtained.  Time-out conducted.  Noted no overlying erythema, induration, or other signs of local infection.  Skin prepped in a sterile fashion.  Local anesthesia: Topical Ethyl chloride.  With sterile technique and under real time ultrasound guidance: Noted trace effusion, 30 mg/2 mL of OrthoVisc (sodium hyaluronate) in a prefilled syringe was injected easily into the knee through a 22-gauge SPINAL needle, I then chased this with 1 mL of lidocaine to clear the needle. Completed without difficulty  Advised to call if fevers/chills, erythema, induration, drainage, or persistent bleeding.  Images permanently stored and available for review in PACS.  Impression: Technically successful ultrasound guided injection.  Independent interpretation of notes and tests performed by another provider:   None.  Brief History, Exam, Impression, and Recommendations:    Primary osteoarthritis of both knees status post meniscal debridement on the left Orthovisc 3 of 4 right knee, we did use a spinal needle.    ____________________________________________ Ihor Austin. Benjamin Stain, M.D., ABFM., CAQSM., AME. Primary Care and Sports Medicine [place] [facility]  Adjunct Professor of Family Medicine  [place] [employer] [facility] of Medicine  Restaurant manager, fast food

## 2022-04-17 ENCOUNTER — Ambulatory Visit (INDEPENDENT_AMBULATORY_CARE_PROVIDER_SITE_OTHER): Payer: BC Managed Care – PPO

## 2022-04-17 ENCOUNTER — Ambulatory Visit: Payer: BC Managed Care – PPO | Admitting: Sports Medicine

## 2022-04-17 DIAGNOSIS — M170 Bilateral primary osteoarthritis of knee: Secondary | ICD-10-CM

## 2022-04-17 MED ORDER — HYALURONAN 30 MG/2ML IX SOSY
30.0000 mg | PREFILLED_SYRINGE | Freq: Once | INTRA_ARTICULAR | Status: AC
  Administered 2022-04-17: 30 mg via INTRA_ARTICULAR

## 2022-04-17 NOTE — Assessment & Plan Note (Addendum)
Orthovisc 4 of 4 right knee doing well, return as needed.  She does plan to get Orthovisc in the left knee again, we started August 16 of this year, so we can restart in the left knee June 13, 2022, the authorization expires earlier in February so we would need to get an additional preauthorization, she will let me know in January and give [place] a heads up to do preauthorization for Orthovisc left knee.

## 2022-04-17 NOTE — Progress Notes (Signed)
    Procedures performed today:    Procedure: Real-time Ultrasound Guided injection of the right knee Device: Samsung HS60  Verbal informed consent obtained.  Time-out conducted.  Noted no overlying erythema, induration, or other signs of local infection.  Skin prepped in a sterile fashion.  Local anesthesia: Topical Ethyl chloride.  With sterile technique and under real time ultrasound guidance: Noted trace effusion, 30 mg/2 mL of OrthoVisc (sodium hyaluronate) in a prefilled syringe was injected easily into the knee through a 22-gauge SPINAL needle, I then chased this with 1 mL of lidocaine to clear the needle. Completed without difficulty  Advised to call if fevers/chills, erythema, induration, drainage, or persistent bleeding.  Images permanently stored and available for review in PACS.  Impression: Technically successful ultrasound guided injection.  Independent interpretation of notes and tests performed by another provider:   None.  Brief History, Exam, Impression, and Recommendations:    Primary osteoarthritis of both knees status post meniscal debridement on the left Orthovisc 4 of 4 right knee doing well, return as needed.  She does plan to get Orthovisc in the left knee again, we started August 16 of this year, so we can restart in the left knee June 13, 2022, the authorization expires earlier in February so we would need to get an additional preauthorization, she will let me know in January and give [place] a heads up to do preauthorization for Orthovisc left knee.    ____________________________________________ Ihor Austin. Benjamin Stain, M.D., ABFM., CAQSM., AME. Primary Care and Sports Medicine  [facility]  Adjunct Professor of Family Medicine  [place] [employer] [facility] of Medicine  Restaurant manager, fast food

## 2022-05-04 ENCOUNTER — Other Ambulatory Visit: Payer: Self-pay | Admitting: Sports Medicine

## 2022-05-04 DIAGNOSIS — E119 Type 2 diabetes mellitus without complications: Secondary | ICD-10-CM

## 2022-05-04 NOTE — Telephone Encounter (Signed)
To PCP

## 2022-05-27 ENCOUNTER — Other Ambulatory Visit: Payer: Self-pay | Admitting: Sports Medicine

## 2022-05-27 DIAGNOSIS — E119 Type 2 diabetes mellitus without complications: Secondary | ICD-10-CM

## 2022-05-28 MED ORDER — TIRZEPATIDE 5 MG/0.5ML ~~LOC~~ SOAJ: 5.0000 mg | SUBCUTANEOUS | 0 refills | Status: DC

## 2022-06-10 ENCOUNTER — Telehealth: Payer: Self-pay

## 2022-06-10 NOTE — Telephone Encounter (Signed)
PA information submitted via [email or web address] for Orthovisc Paperwork has been printed and given to Dr. Darene Lamer for signatures. Once obtained, information will be faxed to [employer] at [phone]

## 2022-06-11 NOTE — Telephone Encounter (Signed)
I spoke to her yesterday and told her I was starting the PA that day and advised her someone will call her when its approved will notify patient upon approval.

## 2022-06-15 ENCOUNTER — Ambulatory Visit: Payer: BC Managed Care – PPO | Admitting: Sports Medicine

## 2022-06-16 ENCOUNTER — Ambulatory Visit: Payer: BC Managed Care – PPO | Admitting: Sports Medicine

## 2022-06-16 ENCOUNTER — Ambulatory Visit (INDEPENDENT_AMBULATORY_CARE_PROVIDER_SITE_OTHER): Payer: BC Managed Care – PPO

## 2022-06-16 DIAGNOSIS — E119 Type 2 diabetes mellitus without complications: Secondary | ICD-10-CM

## 2022-06-16 DIAGNOSIS — M170 Bilateral primary osteoarthritis of knee: Secondary | ICD-10-CM

## 2022-06-16 MED ORDER — MOUNJARO 12.5 MG/0.5ML ~~LOC~~ SOAJ: 12.5000 mg | SUBCUTANEOUS | 0 refills | Status: DC

## 2022-06-16 MED ORDER — MOUNJARO 7.5 MG/0.5ML ~~LOC~~ SOAJ: 7.5000 mg | SUBCUTANEOUS | 0 refills | Status: DC

## 2022-06-16 MED ORDER — MOUNJARO 15 MG/0.5ML ~~LOC~~ SOAJ: 15.0000 mg | SUBCUTANEOUS | 11 refills | Status: DC

## 2022-06-16 MED ORDER — MOUNJARO 10 MG/0.5ML ~~LOC~~ SOAJ: 10.0000 mg | SUBCUTANEOUS | 0 refills | Status: DC

## 2022-06-16 MED ORDER — HYALURONAN 30 MG/2ML IX SOSY
30.0000 mg | PREFILLED_SYRINGE | Freq: Once | INTRA_ARTICULAR | Status: AC
  Administered 2022-06-16: 30 mg via INTRA_ARTICULAR

## 2022-06-16 NOTE — Assessment & Plan Note (Signed)
Doing well on 5 mg of Mounjaro, I will send in all the remaining doses, at the beginning of March she will be going on a trip to [place] for about 6 weeks, so I suspect she will need to have the 7.5 and 10 mg forms dispensed at once. If the pharmacy is unwilling to do this she will just need to have her 10 mg Mounjaro mailed to her. She will be due for an A1c in the middle of March, so she can likely just follow-up with her PCP before she leaves for the A1c.

## 2022-06-16 NOTE — Patient Instructions (Signed)
Office Manager: [email or web address]

## 2022-06-16 NOTE — Progress Notes (Signed)
    Procedures performed today:    Procedure: Real-time Ultrasound Guided injection of the left knee Device: Samsung HS60  Verbal informed consent obtained.  Time-out conducted.  Noted no overlying erythema, induration, or other signs of local infection.  Skin prepped in a sterile fashion.  Local anesthesia: Topical Ethyl chloride.  With sterile technique and under real time ultrasound guidance: Trace effusion noted, 30 mg/2 mL of OrthoVisc (sodium hyaluronate) in a prefilled syringe was injected easily into the knee through a 22-gauge spinal needle, I changed the Orthovisc with 1 mL of lidocaine. Completed without difficulty  Advised to call if fevers/chills, erythema, induration, drainage, or persistent bleeding.  Images permanently stored and available for review in PACS.  Impression: Technically successful ultrasound guided injection.  Independent interpretation of notes and tests performed by another provider:   None.  Brief History, Exam, Impression, and Recommendations:    Primary osteoarthritis of both knees status post meniscal debridement on the left Orthovisc No. 1 of 4 left knee, return in 1 week for #2 of 4.  Controlled type 2 diabetes mellitus without complication, without long-term current use of insulin ([place]) Doing well on 5 mg of Mounjaro, I will send in all the remaining doses, at the beginning of March she will be going on a trip to [place] for about 6 weeks, so I suspect she will need to have the 7.5 and 10 mg forms dispensed at once. If the pharmacy is unwilling to do this she will just need to have her 10 mg Mounjaro mailed to her. She will be due for an A1c in the middle of March, so she can likely just follow-up with her PCP before she leaves for the A1c.    ____________________________________________ Gwen Her. Dianah Field, M.D., ABFM., CAQSM., AME. Primary Care and Sports Medicine [facility]  Adjunct Professor of [place] [employer] [facility] of Medicine  Risk manager

## 2022-06-16 NOTE — Assessment & Plan Note (Signed)
Orthovisc No. 1 of 4 left knee, return in 1 week for #2 of 4.

## 2022-06-22 ENCOUNTER — Ambulatory Visit (INDEPENDENT_AMBULATORY_CARE_PROVIDER_SITE_OTHER): Payer: BC Managed Care – PPO

## 2022-06-22 ENCOUNTER — Ambulatory Visit: Payer: BC Managed Care – PPO | Admitting: Sports Medicine

## 2022-06-22 DIAGNOSIS — M170 Bilateral primary osteoarthritis of knee: Secondary | ICD-10-CM

## 2022-06-22 NOTE — Assessment & Plan Note (Signed)
Orthovisc 2 of 4 left knee, return in 1 week for #3 of 4. We did use a spinal needle.

## 2022-06-22 NOTE — Progress Notes (Signed)
    Procedures performed today:    Procedure: Real-time Ultrasound Guided injection of the left knee Device: Samsung HS60  Verbal informed consent obtained.  Time-out conducted.  Noted no overlying erythema, induration, or other signs of local infection.  Skin prepped in a sterile fashion.  Local anesthesia: Topical Ethyl chloride.  With sterile technique and under real time ultrasound guidance: Trace effusion noted, 30 mg/2 mL of OrthoVisc (sodium hyaluronate) in a prefilled syringe was injected easily into the knee through a 22-gauge spinal needle, I changed the Orthovisc with 1 mL of lidocaine. Completed without difficulty  Advised to call if fevers/chills, erythema, induration, drainage, or persistent bleeding.  Images permanently stored and available for review in PACS.  Impression: Technically successful ultrasound guided injection.  Independent interpretation of notes and tests performed by another provider:   None.  Brief History, Exam, Impression, and Recommendations:    Primary osteoarthritis of both knees status post meniscal debridement on the left Orthovisc 2 of 4 left knee, return in 1 week for #3 of 4. We did use a spinal needle.    ____________________________________________ Gwen Her. Dianah Field, M.D., ABFM., CAQSM., AME. Primary Care and Sports Medicine [facility]  Adjunct Professor of [place] of [facility] of Medicine  Risk manager

## 2022-06-29 ENCOUNTER — Ambulatory Visit: Payer: BC Managed Care – PPO | Admitting: Sports Medicine

## 2022-06-29 ENCOUNTER — Ambulatory Visit (INDEPENDENT_AMBULATORY_CARE_PROVIDER_SITE_OTHER): Payer: BC Managed Care – PPO | Admitting: Medical-Surgical

## 2022-06-29 ENCOUNTER — Encounter: Payer: Self-pay | Admitting: Medical-Surgical

## 2022-06-29 ENCOUNTER — Ambulatory Visit (INDEPENDENT_AMBULATORY_CARE_PROVIDER_SITE_OTHER): Payer: BC Managed Care – PPO

## 2022-06-29 ENCOUNTER — Other Ambulatory Visit (HOSPITAL_BASED_OUTPATIENT_CLINIC_OR_DEPARTMENT_OTHER): Payer: Self-pay

## 2022-06-29 VITALS — BP 119/74 | HR 68 | Resp 20 | Ht 66.0 in | Wt 242.1 lb

## 2022-06-29 DIAGNOSIS — E119 Type 2 diabetes mellitus without complications: Secondary | ICD-10-CM

## 2022-06-29 DIAGNOSIS — M170 Bilateral primary osteoarthritis of knee: Secondary | ICD-10-CM

## 2022-06-29 DIAGNOSIS — I10 Essential (primary) hypertension: Secondary | ICD-10-CM

## 2022-06-29 DIAGNOSIS — L659 Nonscarring hair loss, unspecified: Secondary | ICD-10-CM

## 2022-06-29 DIAGNOSIS — L680 Hirsutism: Secondary | ICD-10-CM

## 2022-06-29 MED ORDER — HYALURONAN 30 MG/2ML IX SOSY
30.0000 mg | PREFILLED_SYRINGE | Freq: Once | INTRA_ARTICULAR | Status: AC
  Administered 2022-06-29: 30 mg via INTRA_ARTICULAR

## 2022-06-29 MED ORDER — SPIRONOLACTONE 50 MG PO TABS: ORAL_TABLET | ORAL | 0 refills | Status: DC

## 2022-06-29 MED ORDER — MOUNJARO 10 MG/0.5ML ~~LOC~~ SOAJ
10.0000 mg | SUBCUTANEOUS | 0 refills | Status: DC
  Filled 2022-06-29: qty 2, 28d supply, fill #0

## 2022-06-29 NOTE — Progress Notes (Addendum)
    Procedures performed today:    Procedure: Real-time Ultrasound Guided injection of the left knee Device: Samsung HS60  Verbal informed consent obtained.  Time-out conducted.  Noted no overlying erythema, induration, or other signs of local infection.  Skin prepped in a sterile fashion.  Local anesthesia: Topical Ethyl chloride.  With sterile technique and under real time ultrasound guidance: Trace effusion noted, 30 mg/2 mL of OrthoVisc (sodium hyaluronate) in a prefilled syringe was injected easily into the knee through a 22-gauge spinal needle, I chased the Orthovisc with 1 mL of lidocaine. Completed without difficulty  Advised to call if fevers/chills, erythema, induration, drainage, or persistent bleeding.  Images permanently stored and available for review in PACS.  Impression: Technically successful ultrasound guided injection.  Independent interpretation of notes and tests performed by another provider:   None.  Brief History, Exam, Impression, and Recommendations:    Primary osteoarthritis of both knees status post meniscal debridement on the left Orthovisc 3 of 4 left knee with a spinal needle, return in 1 week for #4 of 4.    ____________________________________________ Gwen Her. Dianah Field, M.D., ABFM., CAQSM., AME. Primary Care and Sports Medicine [facility] [facility]  Adjunct Professor of [place] [employer] [facility] of Medicine  Risk manager

## 2022-06-29 NOTE — Addendum Note (Signed)
Addended by: Tarri Glenn A on: 06/29/2022 10:20 AM   Modules accepted: Orders

## 2022-06-29 NOTE — Assessment & Plan Note (Signed)
Orthovisc 3 of 4 left knee with a spinal needle, return in 1 week for #4 of 4.

## 2022-06-29 NOTE — Progress Notes (Signed)
Established Patient Office Visit  Subjective   Patient ID: Mariah Jones, female   DOB: [birth year] Age: 62 y.o. MRN: [id number]   Chief Complaint  Patient presents with   Follow-up   Weight Management Screening   HPI Pleasant 62 year old female presenting today for the following:  Diabetes/weight: Currently taking Mounjaro 7.5 mg weekly as prescribed, tolerating well without side effects.  Plans to leave for [place] and will be increasing the dose to 10 mg daily.  Notes that the medication has been helping with her appetite and she is doing much better on behaviors.  She has lost from approximately 268 pounds to 242 pounds today.  Very happy with the results so far and reports that she feels the best that she has felt in a long time.  Hair loss: Has been experiencing severe hair loss recently.  Is very concerned because she always had a lot of hair but it has gotten very thin.   Having difficulty with thick hairs growing on her chin.  Noticed that this seems to worsen with menopause and wonders if there is anything that we can do to help the issue.  Hypertension: Taking chlorthalidone 25 mg daily, tolerating well without side effects.  Has been monitoring her blood pressure at home and notes that her readings have all been good.   Objective:    Vitals:   06/29/22 0859  BP: 119/74  Pulse: 68  Resp: 20  Height: '5\' 6"'$  (1.676 m)  Weight: 242 lb 1.6 oz (109.8 kg)  SpO2: 97%  BMI (Calculated): 39.09    Physical Exam Vitals reviewed.  Constitutional:      General: She is not in acute distress.    Appearance: Normal appearance. She is obese. She is not ill-appearing.  HENT:     Head: Normocephalic and atraumatic.  Cardiovascular:     Rate and Rhythm: Normal rate and regular rhythm.     Pulses: Normal pulses.     Heart sounds: Normal heart sounds.  Pulmonary:     Effort: Pulmonary effort is normal. No respiratory distress.     Breath sounds: Normal breath sounds. No  wheezing, rhonchi or rales.  Skin:    General: Skin is warm and dry.  Neurological:     Mental Status: She is alert and oriented to person, place, and time.  Psychiatric:        Mood and Affect: Mood normal.        Behavior: Behavior normal.        Thought Content: Thought content normal.        Judgment: Judgment normal.    No results found for this or any previous visit (from the past 24 hour(s)).     The 10-year ASCVD risk score (Arnett DK, et al., 2019) is: 10.3%   Values used to calculate the score:     Age: 71 years     Sex: Female     Is Non-Hispanic African American: No     Diabetic: Yes     Tobacco smoker: No     Systolic Blood Pressure: 123456 mmHg     Is BP treated: Yes     HDL Cholesterol: 42 mg/dL     Total Cholesterol: 225 mg/dL   Assessment & Plan:   1. Morbid obesity ([place]) 2. Controlled type 2 diabetes mellitus without complication, without long-term current use of insulin (HCC) Continue Mounjaro 7.5 mg then increase to 10 mg weekly as scheduled. - tirzepatide (MOUNJARO) 10 MG/0.5ML Pen; Inject  10 mg into the skin once a week.  Dispense: 2 mL; Refill: 0  3. Primary hypertension Checking labs as below.  Blood pressure looks good so continue chlorthalidone 25 mg daily. - CBC with Differential/Platelet - Lipid panel - COMPLETE METABOLIC PANEL WITH GFR  4. Hair loss Checking labs as below. - TSH - Iron, TIBC and Ferritin Panel - T4, free  5. Hirsutism Discussed management of hirsutism.  Starting spironolactone 25 mg twice daily for 1 week and if blood pressure remains stable, increased to 50 mg twice daily. - spironolactone (ALDACTONE) 50 MG tablet; For the first week take 1/2 tablet twice daily. If BP good, increase to 1 full tablet twice daily.  Dispense: 180 tablet; Refill: 0  Return in about 6 weeks (around 08/10/2022) for DM follow up.  ___________________________________________ Clearnce Sorrel, DNP, APRN, FNP-BC Primary Care and [place]

## 2022-06-30 LAB — CBC WITH DIFFERENTIAL/PLATELET
Absolute Monocytes: 785 cells/uL (ref 200–950)
Basophils Absolute: 76 cells/uL (ref 0–200)
Basophils Relative: 0.7 %
Eosinophils Absolute: 207 cells/uL (ref 15–500)
Eosinophils Relative: 1.9 %
HCT: 45.8 % — ABNORMAL HIGH (ref 35.0–45.0)
Hemoglobin: 15.4 g/dL (ref 11.7–15.5)
Lymphs Abs: 3401 cells/uL (ref 850–3900)
MCH: 27.9 pg (ref 27.0–33.0)
MCHC: 33.6 g/dL (ref 32.0–36.0)
MCV: 83.1 fL (ref 80.0–100.0)
MPV: 11 fL (ref 7.5–12.5)
Monocytes Relative: 7.2 %
Neutro Abs: 6431 cells/uL (ref 1500–7800)
Neutrophils Relative %: 59 %
Platelets: 341 10*3/uL (ref 140–400)
RBC: 5.51 10*6/uL — ABNORMAL HIGH (ref 3.80–5.10)
RDW: 13.8 % (ref 11.0–15.0)
Total Lymphocyte: 31.2 %
WBC: 10.9 10*3/uL — ABNORMAL HIGH (ref 3.8–10.8)

## 2022-06-30 LAB — COMPLETE METABOLIC PANEL WITH GFR
AG Ratio: 1.6 (calc) (ref 1.0–2.5)
ALT: 34 U/L — ABNORMAL HIGH (ref 6–29)
AST: 31 U/L (ref 10–35)
Albumin: 4.5 g/dL (ref 3.6–5.1)
Alkaline phosphatase (APISO): 55 U/L (ref 37–153)
BUN: 17 mg/dL (ref 7–25)
CO2: 33 mmol/L — ABNORMAL HIGH (ref 20–32)
Calcium: 10.1 mg/dL (ref 8.6–10.4)
Chloride: 99 mmol/L (ref 98–110)
Creat: 0.86 mg/dL (ref 0.50–1.05)
Globulin: 2.8 g/dL (calc) (ref 1.9–3.7)
Glucose, Bld: 93 mg/dL (ref 65–99)
Potassium: 3.6 mmol/L (ref 3.5–5.3)
Sodium: 142 mmol/L (ref 135–146)
Total Bilirubin: 1.6 mg/dL — ABNORMAL HIGH (ref 0.2–1.2)
Total Protein: 7.3 g/dL (ref 6.1–8.1)
eGFR: 77 mL/min/{1.73_m2} (ref >=60)

## 2022-06-30 LAB — LIPID PANEL
Cholesterol: 215 mg/dL — ABNORMAL HIGH (ref <200)
HDL: 34 mg/dL — ABNORMAL LOW (ref >=50)
LDL Cholesterol (Calc): 146 mg/dL (calc) — ABNORMAL HIGH
Non-HDL Cholesterol (Calc): 181 mg/dL (calc) — ABNORMAL HIGH (ref <130)
Total CHOL/HDL Ratio: 6.3 (calc) — ABNORMAL HIGH (ref <5.0)
Triglycerides: 213 mg/dL — ABNORMAL HIGH (ref <150)

## 2022-06-30 LAB — IRON,TIBC AND FERRITIN PANEL
%SAT: 21 % (calc) (ref 16–45)
Ferritin: 257 ng/mL (ref 16–288)
Iron: 66 ug/dL (ref 45–160)
TIBC: 309 mcg/dL (calc) (ref 250–450)

## 2022-06-30 LAB — TSH: TSH: 3.14 mIU/L (ref 0.40–4.50)

## 2022-06-30 LAB — T4, FREE: Free T4: 1.2 ng/dL (ref 0.8–1.8)

## 2022-07-03 ENCOUNTER — Encounter: Payer: Self-pay | Admitting: Sports Medicine

## 2022-07-03 ENCOUNTER — Ambulatory Visit (INDEPENDENT_AMBULATORY_CARE_PROVIDER_SITE_OTHER): Payer: BC Managed Care – PPO

## 2022-07-03 ENCOUNTER — Ambulatory Visit: Payer: BC Managed Care – PPO | Admitting: Sports Medicine

## 2022-07-03 DIAGNOSIS — M170 Bilateral primary osteoarthritis of knee: Secondary | ICD-10-CM

## 2022-07-03 MED ORDER — HYALURONAN 30 MG/2ML IX SOSY
30.0000 mg | PREFILLED_SYRINGE | Freq: Once | INTRA_ARTICULAR | Status: AC
  Administered 2022-07-03: 30 mg via INTRA_ARTICULAR

## 2022-07-03 NOTE — Progress Notes (Signed)
    Procedures performed today:    Procedure: Real-time Ultrasound Guided injection of the left knee Device: Samsung HS60  Verbal informed consent obtained.  Time-out conducted.  Noted no overlying erythema, induration, or other signs of local infection.  Skin prepped in a sterile fashion.  Local anesthesia: Topical Ethyl chloride.  With sterile technique and under real time ultrasound guidance: Trace effusion noted, 30 mg/2 mL of OrthoVisc (sodium hyaluronate) in a prefilled syringe was injected easily into the knee through a 22-gauge spinal needle, I chased the Orthovisc with 1 mL of lidocaine. Completed without difficulty  Advised to call if fevers/chills, erythema, induration, drainage, or persistent bleeding.  Images permanently stored and available for review in PACS.  Impression: Technically successful ultrasound guided injection.  Independent interpretation of notes and tests performed by another provider:   None.  Brief History, Exam, Impression, and Recommendations:    Primary osteoarthritis of both knees status post meniscal debridement on the left Orthovisc 4 of 4 left knee spinal needle, return as needed. Starting to feel a little bit better.    ____________________________________________ Gwen Her. Dianah Field, M.D., ABFM., CAQSM., AME. Primary Care and Sports Medicine [facility] [place] [facility]  Adjunct Professor of [place] of [facility] of Medicine  Risk manager

## 2022-07-03 NOTE — Assessment & Plan Note (Signed)
Orthovisc 4 of 4 left knee spinal needle, return as needed. Starting to feel a little bit better.

## 2022-07-03 NOTE — Addendum Note (Signed)
Addended by: Tarri Glenn A on: 07/03/2022 09:43 AM   Modules accepted: Orders

## 2022-08-10 ENCOUNTER — Other Ambulatory Visit: Payer: Self-pay | Admitting: Medical-Surgical

## 2022-08-10 DIAGNOSIS — E119 Type 2 diabetes mellitus without complications: Secondary | ICD-10-CM

## 2022-08-10 MED ORDER — MOUNJARO 10 MG/0.5ML ~~LOC~~ SOAJ
10.0000 mg | SUBCUTANEOUS | 0 refills | Status: DC
  Filled 2022-08-10: qty 2, 28d supply, fill #0

## 2022-08-11 ENCOUNTER — Ambulatory Visit: Payer: BC Managed Care – PPO | Admitting: Medical-Surgical

## 2022-08-11 ENCOUNTER — Other Ambulatory Visit (HOSPITAL_BASED_OUTPATIENT_CLINIC_OR_DEPARTMENT_OTHER): Payer: Self-pay

## 2022-08-11 ENCOUNTER — Encounter: Payer: Self-pay | Admitting: Medical-Surgical

## 2022-08-11 VITALS — BP 110/77 | HR 79 | Temp 97.7°F | Ht 66.0 in | Wt 232.0 lb

## 2022-08-11 DIAGNOSIS — E119 Type 2 diabetes mellitus without complications: Secondary | ICD-10-CM

## 2022-08-11 DIAGNOSIS — N390 Urinary tract infection, site not specified: Secondary | ICD-10-CM

## 2022-08-11 HISTORY — DX: Urinary tract infection, site not specified: N39.0

## 2022-08-11 LAB — POCT URINALYSIS DIP (MANUAL ENTRY)
Blood, UA: NEGATIVE
Glucose, UA: NEGATIVE mg/dL
Ketones, POC UA: NEGATIVE mg/dL
Leukocytes, UA: NEGATIVE
Nitrite, UA: NEGATIVE
Protein Ur, POC: 30 mg/dL — AB
Spec Grav, UA: 1.02 (ref 1.010–1.025)
Urobilinogen, UA: 1 E.U./dL
pH, UA: 7 (ref 5.0–8.0)

## 2022-08-11 LAB — POCT GLYCOSYLATED HEMOGLOBIN (HGB A1C): Hemoglobin A1C: 5.4 % (ref 4.0–5.6)

## 2022-08-11 LAB — POCT UA - MICROALBUMIN
Creatinine, POC: 300 mg/dL
Microalbumin Ur, POC: 80 mg/L

## 2022-08-11 MED ORDER — LISINOPRIL 2.5 MG PO TABS
2.5000 mg | ORAL_TABLET | Freq: Every day | ORAL | 1 refills | Status: DC
  Filled 2022-08-11: qty 90, 90d supply, fill #0

## 2022-08-11 NOTE — Progress Notes (Signed)
        Established patient visit  History, exam, impression, and plan:  Controlled type 2 diabetes mellitus without complication, without long-term current use of insulin (HCC) Pleasant 62 female presenting today for recheck of hemoglobin A1c on Mounjaro.  Has been on 10 mg weekly, tolerating well without side effects.  Notable for 10 pound weight loss over the last 6 weeks despite being [place] on vacation for 5 weeks.  Not checking sugars at home but is cognizant of dietary limitations.  Last hemoglobin A1c 7.1% in December.  Today, her A1c has come down to 5.4% and she is very happy with her results.  POCT microalbumin abnormal.  Adding lisinopril 2.5 mg daily protection.  Remains hesitant to consider statin medication at this time.  Having an updated eye exam today.  Since she is going back out of town for a business trip to [place], continue [place] 10 mg weekly as prescribed.  When she has come back, we can certainly look at increasing to 12.5 mg weekly if desired.  Recurrent UTI No prior history of UTIs however after her trip overseas, she developed an E. coli UTI that was quite resistant but finally cleared.  During her trip overseas to [place] this past month, she had a recurrence of her symptoms.  She was seen in [place] by provider who reported her urine looked "terrible".  She was provided with a 5-day course of Macrobid which cleared her symptoms completely.  Is worried about a recurrence of her urinary symptoms while on this and strips.  She does practice good bladder hygiene however she notes that when she is traveling, she tends to hold her urine too long.  Advised staying very well-hydrated and avoiding frequently.  POCT urinalysis positive for bilirubin and small amount of protein.  Sending for culture.  Given her long periods of time spent out of the country, we may need to provide her with a prophylactic course of Macrobid should her symptoms recur but we will wait for culture results  to come back.  Procedures performed this visit: None.  Return in about 6 months (around 02/10/2023) for DM follow up.  __________________________________ Thayer Ohm, DNP, APRN, FNP-BC Primary Care and Sports Medicine [facility]

## 2022-08-11 NOTE — Assessment & Plan Note (Signed)
No prior history of UTIs however after her trip overseas, she developed an E. coli UTI that was quite resistant but finally cleared.  During her trip overseas to [place] this past month, she had a recurrence of her symptoms.  She was seen in [place] by provider who reported her urine looked "terrible".  She was provided with a 5-day course of Macrobid which cleared her symptoms completely.  Is worried about a recurrence of her urinary symptoms while on this and strips.  She does practice good bladder hygiene however she notes that when she is traveling, she tends to hold her urine too long.  Advised staying very well-hydrated and avoiding frequently.  POCT urinalysis positive for bilirubin and small amount of protein.  Sending for culture.  Given her long periods of time spent out of the country, we may need to provide her with a prophylactic course of Macrobid should her symptoms recur but we will wait for culture results to come back.

## 2022-08-11 NOTE — Assessment & Plan Note (Signed)
Pleasant 60 femal31e presenting today for recheck of hemoglobin A1c on Mounjaro.  Has been on 10 mg weekly, tolerating well without side effects.  Notable for 10 pound weight loss over the last 6 weeks despite being [place] on vacation for 5 weeks.  Not checking sugars at home but is cognizant of dietary limitations.  Last hemoglobin A1c 7.1% in December.  Today, her A1c has come down to 5.4% and she is very happy with her results.  POCT microalbumin abnormal.  Adding lisinopril 2.5 mg daily protection.  Remains hesitant to consider statin medication at this time.  Having an updated eye exam today.  Since she is going back out of town for a business trip to [place], continue [place] 10 mg weekly as prescribed.  When she has come back, we can certainly look at increasing to 12.5 mg weekly if desired.

## 2022-08-13 ENCOUNTER — Telehealth: Payer: Self-pay

## 2022-08-13 NOTE — Telephone Encounter (Signed)
I called and spoke to the insurance company and asked for a updates on her claim and the lady I spoke to states they denied the claim because she exceeded her gel injections coverage and they said they already spoke to her about that so if there needs to be any more research the ref# is [id number] if you call the customer service number and ask to speak to the claim department.

## 2022-08-20 ENCOUNTER — Telehealth: Payer: Self-pay | Admitting: Medical-Surgical

## 2022-08-20 NOTE — Telephone Encounter (Signed)
Left detailed VM with dates she needed.

## 2022-08-20 NOTE — Telephone Encounter (Signed)
Mariah Jones from [employer] called about an orthovisc PA for the patient. She is wanting to clarify some approval dates.   [phone] extension [phone]

## 2022-08-24 ENCOUNTER — Encounter: Payer: Self-pay | Admitting: Medical-Surgical

## 2022-08-24 NOTE — Telephone Encounter (Addendum)
Call reference: [id number].    Local bcbs: [phone].  On the line getting transferred to different departments for 1 hour, has to hang up to start afternoon clinic.

## 2022-08-26 NOTE — Telephone Encounter (Signed)
Contacted [employer] at [phone] regarding the Orthovisc injections for R knee on 12/4, 12/11, 12/18, and 12/22 for patient. Per Hailey, the authorization has been updated to cover the Orthovisc process as bilateral knee injections because current auth only has the L knee noted. The case is under a review status. Tracking # [id number]. An auth determination will be available within 24 hrs. The Rep was optimistic that the retro-active process should be approved and covered for the patient. I have called the patient directly and have provided the current information. Patient is aware I will contact her with updates as needed.

## 2022-08-27 NOTE — Telephone Encounter (Signed)
Fax correspondence received that clinical information or peer to peer review is required to complete retro-active auth request. Contacted [employer] directly and submitted the necessary clinical information to the medical reviewer. Retro-active auth for R Knee injections approved by the insurance. Authorization: [id number]. Valid from 12/05/21 through 06/02/22. The original auth order on file has been updated from L Knee to bilateral knee injections (8 doses = 4 injections/per knee).  Patient has been updated of the following determination. During the call, she mentioned she will need a new pre-auth for her next R knee injections in June 2024 and would like to avoid any unnecessary delays. Janyth Pupa, please add this request to your Orthovisc workqueue. Thanks!

## 2022-08-27 NOTE — Telephone Encounter (Signed)
Lawd Jesus thank you everyone!

## 2022-08-28 ENCOUNTER — Encounter: Payer: Self-pay | Admitting: Medical-Surgical

## 2022-08-28 ENCOUNTER — Other Ambulatory Visit (HOSPITAL_BASED_OUTPATIENT_CLINIC_OR_DEPARTMENT_OTHER): Payer: Self-pay

## 2022-08-28 ENCOUNTER — Ambulatory Visit: Payer: BC Managed Care – PPO | Admitting: Medical-Surgical

## 2022-08-28 VITALS — BP 114/76 | HR 71 | Resp 20 | Ht 66.0 in | Wt 229.5 lb

## 2022-08-28 DIAGNOSIS — E119 Type 2 diabetes mellitus without complications: Secondary | ICD-10-CM | POA: Diagnosis not present

## 2022-08-28 DIAGNOSIS — Z7689 Persons encountering health services in other specified circumstances: Secondary | ICD-10-CM

## 2022-08-28 DIAGNOSIS — M25561 Pain in right knee: Secondary | ICD-10-CM | POA: Diagnosis not present

## 2022-08-28 MED ORDER — PREDNISONE 50 MG PO TABS: 50.0000 mg | ORAL_TABLET | Freq: Every day | ORAL | 0 refills | Status: DC

## 2022-08-28 MED ORDER — TIRZEPATIDE 12.5 MG/0.5ML ~~LOC~~ SOAJ
12.5000 mg | SUBCUTANEOUS | 0 refills | Status: DC
  Filled 2022-08-28 – 2022-09-08 (×2): qty 2, 28d supply, fill #0
  Filled 2022-10-01: qty 2, 28d supply, fill #1
  Filled 2022-10-28: qty 2, 28d supply, fill #2

## 2022-08-28 NOTE — Progress Notes (Signed)
        Established patient visit  History, exam, impression, and plan:  1. Acute pain of right knee Pleasant 62 year old female with a history of chronic right knee pain and osteoarthritis presenting today for new onset acute pain that started last night.  She was seated and when she rose to a standing position, she reports her right knee gave way and she experienced severe pain.  She took some ibuprofen last night after crawling to the bed because she could not stand.  She woke this morning and took more ibuprofen.  Was able to go to work but a little later, after sitting for a while, she stood up and the knee gave way again.  She is now experiencing anterior right knee pain below the patella with full extension and weightbearing.  No popping, clicking, or locking.  Using a walker for ambulation as a cane was not offloading the pressure enough.  Per sports medicine notations, planning for Orthovisc to the right knee once this is approved.  Discussed updating x-rays however felt this was not necessary.  Offered pain medication but declined due to multiple allergies and intolerances.  Plans to continue ibuprofen 800 mg every 8 hours as needed.  Recommend compression, elevation, and ice.  Adding prednisone 50 mg daily x 5 days.  Advised to hold ibuprofen dosing while on prednisone but okay to resume once the burst is complete.  2. Controlled type 2 diabetes mellitus without complication, without long-term current use of insulin (HCC) 3. Encounter for weight management Has been doing well Mounjaro 10 mg weekly.  Was due to increase this to 12.5 mg weekly however supply was a big concern.  She reports that the pharmacist has advised her that they are getting supplies in and that she would need to have an active prescription on file for the 12.5 mg dose.  Sending that in today so they will pull this back for her.    Procedures performed this visit: None.  No follow-ups on  file.  __________________________________ Thayer Ohm, DNP, APRN, FNP-BC Primary Care and Sports Medicine [facility]

## 2022-08-28 NOTE — Telephone Encounter (Signed)
Hey thank you for taking care of that and also after all that transpired with that case so the the patient can get better customer service please get somebody else to do this cause I don't feel comfortable dealing with her cases in the near future.

## 2022-09-07 ENCOUNTER — Other Ambulatory Visit (HOSPITAL_BASED_OUTPATIENT_CLINIC_OR_DEPARTMENT_OTHER): Payer: Self-pay

## 2022-09-08 ENCOUNTER — Other Ambulatory Visit (HOSPITAL_BASED_OUTPATIENT_CLINIC_OR_DEPARTMENT_OTHER): Payer: Self-pay

## 2022-09-16 NOTE — Telephone Encounter (Signed)
PA information submitted via [email or web address] for Orthovisc Paperwork has been printed and given to Dr. T for signatures. Once obtained, information will be faxed to [employer] at [phone]  

## 2022-09-18 NOTE — Telephone Encounter (Signed)
PA Approved  Prior Authorization for Orthovisc is on file. as PA Reference #[id number], with valid date range of 06/12/2022 - 12/08/2022.please call patient and let her know.

## 2022-09-18 NOTE — Telephone Encounter (Signed)
Benefits Investigation Details received from [employer] Injection: Orthovisc PA required: No May fill through: [facility] Copay/Coinsurance: $50 Product Copay: 20% Administration Coinsurance: 20% Administration Copay: $0 Deductible: $850 (met: $850) Out of Pocket Max: $2250 (met: $1465.88)  Her weekly co pay to get injections is 216 please let her know that.

## 2022-09-29 ENCOUNTER — Other Ambulatory Visit: Payer: Self-pay | Admitting: Medical-Surgical

## 2022-09-29 DIAGNOSIS — Z1231 Encounter for screening mammogram for malignant neoplasm of breast: Secondary | ICD-10-CM

## 2022-09-30 ENCOUNTER — Ambulatory Visit (INDEPENDENT_AMBULATORY_CARE_PROVIDER_SITE_OTHER): Payer: BC Managed Care – PPO

## 2022-09-30 DIAGNOSIS — Z1231 Encounter for screening mammogram for malignant neoplasm of breast: Secondary | ICD-10-CM

## 2022-10-06 ENCOUNTER — Ambulatory Visit (INDEPENDENT_AMBULATORY_CARE_PROVIDER_SITE_OTHER): Payer: BC Managed Care – PPO | Admitting: Sports Medicine

## 2022-10-06 ENCOUNTER — Other Ambulatory Visit (INDEPENDENT_AMBULATORY_CARE_PROVIDER_SITE_OTHER): Payer: BC Managed Care – PPO

## 2022-10-06 DIAGNOSIS — M170 Bilateral primary osteoarthritis of knee: Secondary | ICD-10-CM

## 2022-10-06 MED ORDER — HYALURONAN 30 MG/2ML IX SOSY
30.0000 mg | PREFILLED_SYRINGE | Freq: Once | INTRA_ARTICULAR | Status: AC
Start: 2022-10-06 — End: 2022-10-06
  Administered 2022-10-06: 30 mg via INTRA_ARTICULAR

## 2022-10-06 NOTE — Addendum Note (Signed)
Addended by: Carren Rang A on: 10/06/2022 10:20 AM   Modules accepted: Orders

## 2022-10-06 NOTE — Progress Notes (Signed)
    Procedures performed today:    Procedure: Real-time Ultrasound Guided injection of the right knee Device: Samsung HS60  Verbal informed consent obtained.  Time-out conducted.  Noted no overlying erythema, induration, or other signs of local infection.  Skin prepped in a sterile fashion.  Local anesthesia: Topical Ethyl chloride.  With sterile technique and under real time ultrasound guidance: 30 mg/2 mL of OrthoVisc (sodium hyaluronate) in a prefilled syringe was injected easily into the knee through a 22-gauge spinal needle, I then flushed the syringe with 1 mL of lidocaine. Completed without difficulty  Advised to call if fevers/chills, erythema, induration, drainage, or persistent bleeding.  Images permanently stored and available for review in PACS.  Impression: Technically successful ultrasound guided injection.  Independent interpretation of notes and tests performed by another provider:   None.  Brief History, Exam, Impression, and Recommendations:    Primary osteoarthritis of both knees status post meniscal debridement on the left Orthovisc No. 1 of 4 right knee with spinal needle, return to see me in 1 week for #2 of 4.    ____________________________________________ Ihor Austin. Benjamin Stain, M.D., ABFM., CAQSM., AME. Primary Care and Sports Medicine [facility] [facility]  Adjunct Professor of Family Medicine  [place] [employer] [facility] of Medicine  Restaurant manager, fast food

## 2022-10-06 NOTE — Assessment & Plan Note (Signed)
Orthovisc No. 1 of 4 right knee with spinal needle, return to see me in 1 week for #2 of 4.

## 2022-10-13 ENCOUNTER — Other Ambulatory Visit (INDEPENDENT_AMBULATORY_CARE_PROVIDER_SITE_OTHER): Payer: BC Managed Care – PPO

## 2022-10-13 ENCOUNTER — Ambulatory Visit: Payer: BC Managed Care – PPO | Admitting: Sports Medicine

## 2022-10-13 DIAGNOSIS — M170 Bilateral primary osteoarthritis of knee: Secondary | ICD-10-CM

## 2022-10-13 MED ORDER — HYALURONAN 30 MG/2ML IX SOSY
30.0000 mg | PREFILLED_SYRINGE | Freq: Once | INTRA_ARTICULAR | Status: AC
Start: 2022-10-13 — End: 2022-10-13
  Administered 2022-10-13: 30 mg via INTRA_ARTICULAR

## 2022-10-13 NOTE — Assessment & Plan Note (Signed)
Orthovisc 2 of 4 right knee, we used a spinal needle, return in 1 week for #3 of 4.

## 2022-10-13 NOTE — Progress Notes (Signed)
    Procedures performed today:    Procedure: Real-time Ultrasound Guided injection of the right knee Device: Samsung HS60  Verbal informed consent obtained.  Time-out conducted.  Noted no overlying erythema, induration, or other signs of local infection.  Skin prepped in a sterile fashion.  Local anesthesia: Topical Ethyl chloride.  With sterile technique and under real time ultrasound guidance: 30 mg/2 mL of OrthoVisc (sodium hyaluronate) in a prefilled syringe was injected easily into the knee through a 22-gauge spinal needle, I then flushed the syringe with 1 mL of lidocaine. Completed without difficulty  Advised to call if fevers/chills, erythema, induration, drainage, or persistent bleeding.  Images permanently stored and available for review in PACS.  Impression: Technically successful ultrasound guided injection.  Independent interpretation of notes and tests performed by another provider:   None.  Brief History, Exam, Impression, and Recommendations:    Primary osteoarthritis of both knees status post meniscal debridement on the left Orthovisc 2 of 4 right knee, we used a spinal needle, return in 1 week for #3 of 4.    ____________________________________________ Ihor Austin. Benjamin Stain, M.D., ABFM., CAQSM., AME. Primary Care and Sports Medicine [facility]  Adjunct Professor of Family Medicine  [place] [employer] [facility]  Restaurant manager, fast food

## 2022-10-22 ENCOUNTER — Ambulatory Visit (INDEPENDENT_AMBULATORY_CARE_PROVIDER_SITE_OTHER): Payer: BC Managed Care – PPO

## 2022-10-22 ENCOUNTER — Ambulatory Visit: Payer: BC Managed Care – PPO | Admitting: Sports Medicine

## 2022-10-22 DIAGNOSIS — M170 Bilateral primary osteoarthritis of knee: Secondary | ICD-10-CM

## 2022-10-22 DIAGNOSIS — M19011 Primary osteoarthritis, right shoulder: Secondary | ICD-10-CM

## 2022-10-22 DIAGNOSIS — M7541 Impingement syndrome of right shoulder: Secondary | ICD-10-CM | POA: Diagnosis not present

## 2022-10-22 HISTORY — DX: Primary osteoarthritis, right shoulder: M19.011

## 2022-10-22 NOTE — Assessment & Plan Note (Signed)
Increasing pain right shoulder over the deltoid worse with abduction, impingement signs on exam, adding x-rays, home PT, return in 6 weeks for this.

## 2022-10-22 NOTE — Assessment & Plan Note (Signed)
Orthovisc 3 of 4 right knee, we did use a spinal needle, we chased it with lidocaine. Return in 1 week for #4 of 4.

## 2022-10-22 NOTE — Progress Notes (Signed)
    Procedures performed today:    Procedure: Real-time Ultrasound Guided injection of the right knee Device: Samsung HS60  Verbal informed consent obtained.  Time-out conducted.  Noted no overlying erythema, induration, or other signs of local infection.  Skin prepped in a sterile fashion.  Local anesthesia: Topical Ethyl chloride.  With sterile technique and under real time ultrasound guidance: 30 mg/2 mL of OrthoVisc (sodium hyaluronate) in a prefilled syringe was injected easily into the knee through a 22-gauge spinal needle, I then flushed the syringe with 1 mL of lidocaine. Completed without difficulty  Advised to call if fevers/chills, erythema, induration, drainage, or persistent bleeding.  Images permanently stored and available for review in PACS.  Impression: Technically successful ultrasound guided injection.  Independent interpretation of notes and tests performed by another provider:   None.  Brief History, Exam, Impression, and Recommendations:    Primary osteoarthritis of both knees status post meniscal debridement on the left Orthovisc 3 of 4 right knee, we did use a spinal needle, we chased it with lidocaine. Return in 1 week for #4 of 4.  Impingement syndrome, shoulder, right Increasing pain right shoulder over the deltoid worse with abduction, impingement signs on exam, adding x-rays, home PT, return in 6 weeks for this.    ____________________________________________ Ihor Austin. Benjamin Stain, M.D., ABFM., CAQSM., AME. Primary Care and Sports Medicine [facility]  Adjunct Professor of Family Medicine  [place]  Restaurant manager, fast food

## 2022-10-28 ENCOUNTER — Other Ambulatory Visit (INDEPENDENT_AMBULATORY_CARE_PROVIDER_SITE_OTHER): Payer: BC Managed Care – PPO

## 2022-10-28 ENCOUNTER — Ambulatory Visit: Payer: BC Managed Care – PPO | Admitting: Sports Medicine

## 2022-10-28 DIAGNOSIS — M170 Bilateral primary osteoarthritis of knee: Secondary | ICD-10-CM | POA: Diagnosis not present

## 2022-10-28 NOTE — Assessment & Plan Note (Signed)
Orthovisc 4 of 4 right knee, we did use a spinal needle and chase it with lidocaine, still having some pain, I would like to see her back in approximately 6 weeks and if persistent discomfort we will do PRP, if that does not work we can consider genicular artery embolization, Mariah Jones is highly averse to arthroplasty.

## 2022-10-28 NOTE — Progress Notes (Signed)
    Procedures performed today:    Procedure: Real-time Ultrasound Guided injection of the right knee Device: Samsung HS60  Verbal informed consent obtained.  Time-out conducted.  Noted no overlying erythema, induration, or other signs of local infection.  Skin prepped in a sterile fashion.  Local anesthesia: Topical Ethyl chloride.  With sterile technique and under real time ultrasound guidance: 30 mg/2 mL of OrthoVisc (sodium hyaluronate) in a prefilled syringe was injected easily into the knee through a 22-gauge spinal needle, I then flushed the syringe with 1 mL of lidocaine. Completed without difficulty  Advised to call if fevers/chills, erythema, induration, drainage, or persistent bleeding.  Images permanently stored and available for review in PACS.  Impression: Technically successful ultrasound guided injection.  Independent interpretation of notes and tests performed by another provider:   None.  Brief History, Exam, Impression, and Recommendations:    Primary osteoarthritis of both knees status post meniscal debridement on the left Orthovisc 4 of 4 right knee, we did use a spinal needle and chase it with lidocaine, still having some pain, I would like to see her back in approximately 6 weeks and if persistent discomfort we will do PRP, if that does not work we can consider genicular artery embolization, Azealia is highly averse to arthroplasty.    ____________________________________________ Ihor Austin. Benjamin Stain, M.D., ABFM., CAQSM., AME. Primary Care and Sports Medicine [facility]  Adjunct Professor of Family Medicine  [place] [employer] [facility] [facility] [employer]  Restaurant manager, fast food

## 2022-11-28 ENCOUNTER — Encounter: Payer: Self-pay | Admitting: Medical-Surgical

## 2022-11-30 ENCOUNTER — Other Ambulatory Visit (HOSPITAL_BASED_OUTPATIENT_CLINIC_OR_DEPARTMENT_OTHER): Payer: Self-pay

## 2022-11-30 MED ORDER — MOUNJARO 15 MG/0.5ML ~~LOC~~ SOAJ: 15.0000 mg | SUBCUTANEOUS | 11 refills | Status: DC

## 2022-11-30 MED ORDER — MOUNJARO 15 MG/0.5ML ~~LOC~~ SOAJ
15.0000 mg | SUBCUTANEOUS | 11 refills | Status: DC
  Filled 2022-11-30: qty 2, 28d supply, fill #0
  Filled 2022-12-26: qty 2, 28d supply, fill #1
  Filled 2023-01-19: qty 2, 28d supply, fill #2
  Filled 2023-02-16: qty 2, 28d supply, fill #3

## 2022-11-30 NOTE — Addendum Note (Signed)
Addended by: Chalmers Cater on: 11/30/2022 10:56 AM   Modules accepted: Orders

## 2022-12-03 ENCOUNTER — Ambulatory Visit: Payer: BC Managed Care – PPO | Admitting: Sports Medicine

## 2022-12-03 ENCOUNTER — Encounter: Payer: Self-pay | Admitting: Sports Medicine

## 2022-12-03 ENCOUNTER — Other Ambulatory Visit (INDEPENDENT_AMBULATORY_CARE_PROVIDER_SITE_OTHER): Payer: BC Managed Care – PPO

## 2022-12-03 DIAGNOSIS — M19011 Primary osteoarthritis, right shoulder: Secondary | ICD-10-CM | POA: Diagnosis not present

## 2022-12-03 DIAGNOSIS — M7541 Impingement syndrome of right shoulder: Secondary | ICD-10-CM

## 2022-12-03 NOTE — Assessment & Plan Note (Signed)
Persistent shoulder pain, x-rays did show osteoarthritis, she did have positive glenohumeral signs today, she had no impingement signs and good rotator cuff strength. Due to failure of conservative treatment we proceeded with a glenohumeral joint injection today, she had good immediate pain relief, she can restart home conditioning in about 4 days and return to see me in 6 weeks, but only if needed.

## 2022-12-03 NOTE — Progress Notes (Signed)
    Procedures performed today:    Procedure: Real-time Ultrasound Guided injection of the right glenohumeral joint Device: Samsung HS60  Verbal informed consent obtained.  Time-out conducted.  Noted no overlying erythema, induration, or other signs of local infection.  Skin prepped in a sterile fashion.  Local anesthesia: Topical Ethyl chloride.  With sterile technique and under real time ultrasound guidance: I advanced a 22-gauge spinal needle into the glenohumeral joint from a posterior approach, 1 cc Kenalog 40, 2 cc lidocaine, 2 cc bupivacaine injected easily Completed without difficulty  Advised to call if fevers/chills, erythema, induration, drainage, or persistent bleeding.  Images permanently stored and available for review in PACS.  Impression: Technically successful ultrasound guided injection.  Independent interpretation of notes and tests performed by another provider:   None.  Brief History, Exam, Impression, and Recommendations:    Primary osteoarthritis of right shoulder Persistent shoulder pain, x-rays did show osteoarthritis, she did have positive glenohumeral signs today, she had no impingement signs and good rotator cuff strength. Due to failure of conservative treatment we proceeded with a glenohumeral joint injection today, she had good immediate pain relief, she can restart home conditioning in about 4 days and return to see me in 6 weeks, but only if needed.    ____________________________________________ Mariah Jones. Benjamin Stain, M.D., ABFM., CAQSM., AME. Primary Care and Sports Medicine [facility]  Adjunct Professor of Family Medicine  [place] [employer] [facility] [employer]  Restaurant manager, fast food

## 2022-12-11 ENCOUNTER — Telehealth: Payer: Self-pay | Admitting: Medical-Surgical

## 2022-12-11 NOTE — Telephone Encounter (Signed)
Pt had me schedule the injections and then requested that I send a request for prior auth. Please let me know if I need to reschedule these.

## 2022-12-14 NOTE — Telephone Encounter (Signed)
Ok, FYI to [employer] to start Orthovisc approval for the left knee.

## 2022-12-14 NOTE — Telephone Encounter (Signed)
Lets clarify that we are doing the left knee and if so then Weston Brass can start working on approval.

## 2022-12-15 NOTE — Telephone Encounter (Signed)
PA information submitted via [email or web address] for Orthovisc Paperwork has been printed and given to Dr. T for signatures. Once obtained, information will be faxed to [employer] at [phone]  

## 2022-12-18 ENCOUNTER — Ambulatory Visit: Payer: BC Managed Care – PPO | Admitting: Sports Medicine

## 2022-12-23 ENCOUNTER — Telehealth: Payer: Self-pay | Admitting: Medical-Surgical

## 2022-12-23 NOTE — Telephone Encounter (Signed)
Benefits Investigation Details received from [employer] Injection: Orthovisc May fill through: [place] [facility] Copay/Coinsurance: $50 Product Copay: 20% Administration Coinsurance: 20% Administration Copay: 0% Deductible: $850 (met: $850) Out of Pocket Max: $2250 (met: $2050.97)

## 2022-12-23 NOTE — Telephone Encounter (Signed)
error 

## 2022-12-23 NOTE — Telephone Encounter (Signed)
PA started through her insurance will be notified when approved.

## 2022-12-24 ENCOUNTER — Telehealth: Payer: Self-pay | Admitting: Medical-Surgical

## 2022-12-24 NOTE — Telephone Encounter (Signed)
Renee with [employer] called on behalf of BC/BS.  Re: PA submitted for Ortho Visc.  Need to know: -Patient's last dose and confirm patient has not received Zilretta in the last 6 mnths,  -Confirm beneficial response and tolerability -Verify requested dose-dose asking for does not come in    that dose Telephone [phone]

## 2022-12-25 ENCOUNTER — Ambulatory Visit: Payer: BC Managed Care – PPO | Admitting: Sports Medicine

## 2022-12-29 ENCOUNTER — Other Ambulatory Visit (HOSPITAL_BASED_OUTPATIENT_CLINIC_OR_DEPARTMENT_OTHER): Payer: Self-pay

## 2022-12-31 NOTE — Telephone Encounter (Signed)
PA was approved and if you look at the past message I have her copay and she can be scheduled to get her injections.

## 2023-01-01 ENCOUNTER — Ambulatory Visit: Payer: BC Managed Care – PPO | Admitting: Sports Medicine

## 2023-01-01 ENCOUNTER — Encounter: Payer: Self-pay | Admitting: Sports Medicine

## 2023-01-04 NOTE — Telephone Encounter (Signed)
I am going to forward this to Deidre, there is nothing I can really do about it, Weston Brass told me he was waiting the appropriate 6 months which is needed between injection series so that it would not get denied.

## 2023-01-06 NOTE — Telephone Encounter (Signed)
01/06/23: Left message for patient to contact me and or Vanicia regarding their concern.

## 2023-01-08 ENCOUNTER — Ambulatory Visit: Payer: BC Managed Care – PPO | Admitting: Sports Medicine

## 2023-01-08 NOTE — Telephone Encounter (Signed)
Task completed. Patient returned a call back to Mariah Jones regarding the authorization for the OrthoVisc injection. Patient was concerned because the prior auth process was not communicated to her. During  the call, the patient was notified that prior auth for OrthorVisc has been completed and authorized. Per patient's request she was transferred to the front office for scheduling. No other inquiries noted during the call.

## 2023-01-14 ENCOUNTER — Ambulatory Visit: Payer: BC Managed Care – PPO | Admitting: Sports Medicine

## 2023-01-19 ENCOUNTER — Ambulatory Visit: Payer: BC Managed Care – PPO | Admitting: Sports Medicine

## 2023-01-21 ENCOUNTER — Ambulatory Visit: Payer: BC Managed Care – PPO | Admitting: Sports Medicine

## 2023-01-21 ENCOUNTER — Other Ambulatory Visit (INDEPENDENT_AMBULATORY_CARE_PROVIDER_SITE_OTHER): Payer: BC Managed Care – PPO

## 2023-01-21 DIAGNOSIS — M170 Bilateral primary osteoarthritis of knee: Secondary | ICD-10-CM

## 2023-01-21 MED ORDER — HYALURONAN 30 MG/2ML IX SOSY
30.0000 mg | PREFILLED_SYRINGE | Freq: Once | INTRA_ARTICULAR | Status: AC
Start: 2023-01-21 — End: 2023-01-21
  Administered 2023-01-21: 30 mg via INTRA_ARTICULAR

## 2023-01-21 NOTE — Addendum Note (Signed)
Addended by: Carren Rang A on: 01/21/2023 02:34 PM   Modules accepted: Orders

## 2023-01-21 NOTE — Assessment & Plan Note (Addendum)
Right knee is doing really well after Orthovisc 4 of 4 finished in July, today we are starting Orthovisc on the left knee. Return to see me in a week for Orthovisc No. 2 of 4. If persistent discomfort we will do PRP and if that does not work we will consider geniculate artery embolization. She is averse to arthroplasty.

## 2023-01-21 NOTE — Progress Notes (Signed)
    Procedures performed today:    Procedure: Real-time Ultrasound Guided injection of the left knee Device: Samsung HS60  Verbal informed consent obtained.  Time-out conducted.  Noted no overlying erythema, induration, or other signs of local infection.  Skin prepped in a sterile fashion.  Local anesthesia: Topical Ethyl chloride.  With sterile technique and under real time ultrasound guidance: Trace effusion noted, 30 mg/2 mL of OrthoVisc (sodium hyaluronate) in a prefilled syringe was injected easily into the knee through a 22-gauge needle. Completed without difficulty  Advised to call if fevers/chills, erythema, induration, drainage, or persistent bleeding.  Images permanently stored and available for review in PACS.  Impression: Technically successful ultrasound guided injection.  Independent interpretation of notes and tests performed by another provider:   None.  Brief History, Exam, Impression, and Recommendations:    Primary osteoarthritis of both knees status post meniscal debridement on the left Right knee is doing really well after Orthovisc 4 of 4 finished in July, today we are starting Orthovisc on the left knee. Return to see me in a week for Orthovisc No. 2 of 4. If persistent discomfort we will do PRP and if that does not work we will consider geniculate artery embolization. She is averse to arthroplasty.    ____________________________________________ Ihor Austin. Benjamin Stain, M.D., ABFM., CAQSM., AME. Primary Care and Sports Medicine [facility]  Adjunct Professor of Family Medicine  [employer]  Restaurant manager, fast food

## 2023-02-01 ENCOUNTER — Ambulatory Visit: Payer: BC Managed Care – PPO | Admitting: Sports Medicine

## 2023-02-01 ENCOUNTER — Other Ambulatory Visit (INDEPENDENT_AMBULATORY_CARE_PROVIDER_SITE_OTHER): Payer: BC Managed Care – PPO

## 2023-02-01 DIAGNOSIS — M170 Bilateral primary osteoarthritis of knee: Secondary | ICD-10-CM

## 2023-02-01 MED ORDER — HYALURONAN 30 MG/2ML IX SOSY
30.0000 mg | PREFILLED_SYRINGE | Freq: Once | INTRA_ARTICULAR | Status: AC
Start: 2023-02-01 — End: 2023-02-01
  Administered 2023-02-01: 30 mg via INTRA_ARTICULAR

## 2023-02-01 NOTE — Assessment & Plan Note (Signed)
Right knee continues to do well after Orthovisc finished in July, today we did Orthovisc 2 of 4 left knee, return in 1 week for #3 of 4. We will consider PRP and geniculate artery embolization if this does not work.

## 2023-02-01 NOTE — Progress Notes (Signed)
    Procedures performed today:    Procedure: Real-time Ultrasound Guided injection of the left knee Device: Samsung HS60  Verbal informed consent obtained.  Time-out conducted.  Noted no overlying erythema, induration, or other signs of local infection.  Skin prepped in a sterile fashion.  Local anesthesia: Topical Ethyl chloride.  With sterile technique and under real time ultrasound guidance: Trace effusion noted, 30 mg/2 mL of OrthoVisc (sodium hyaluronate) in a prefilled syringe was injected easily into the knee through a 22-gauge needle. Completed without difficulty  Advised to call if fevers/chills, erythema, induration, drainage, or persistent bleeding.  Images permanently stored and available for review in PACS.  Impression: Technically successful ultrasound guided injection.  Independent interpretation of notes and tests performed by another provider:   None.  Brief History, Exam, Impression, and Recommendations:    Primary osteoarthritis of both knees status post meniscal debridement on the left Right knee continues to do well after Orthovisc finished in July, today we did Orthovisc 2 of 4 left knee, return in 1 week for #3 of 4. We will consider PRP and geniculate artery embolization if this does not work.    ____________________________________________ Ihor Austin. Benjamin Stain, M.D., ABFM., CAQSM., AME. Primary Care and Sports Medicine [facility] [facility]  Adjunct Professor of Family Medicine  [place] [employer] [facility] [employer]  Restaurant manager, fast food

## 2023-02-01 NOTE — Addendum Note (Signed)
Addended by: Carren Rang A on: 02/01/2023 03:06 PM   Modules accepted: Orders

## 2023-02-10 ENCOUNTER — Ambulatory Visit: Payer: BC Managed Care – PPO | Admitting: Medical-Surgical

## 2023-02-10 ENCOUNTER — Ambulatory Visit: Payer: BC Managed Care – PPO | Admitting: Sports Medicine

## 2023-02-10 ENCOUNTER — Other Ambulatory Visit (INDEPENDENT_AMBULATORY_CARE_PROVIDER_SITE_OTHER): Payer: BC Managed Care – PPO

## 2023-02-10 DIAGNOSIS — L659 Nonscarring hair loss, unspecified: Secondary | ICD-10-CM

## 2023-02-10 DIAGNOSIS — M170 Bilateral primary osteoarthritis of knee: Secondary | ICD-10-CM

## 2023-02-10 HISTORY — DX: Nonscarring hair loss, unspecified: L65.9

## 2023-02-10 NOTE — Progress Notes (Signed)
    Procedures performed today:    Procedure: Real-time Ultrasound Guided injection of the left knee Device: Samsung HS60  Verbal informed consent obtained.  Time-out conducted.  Noted no overlying erythema, induration, or other signs of local infection.  Skin prepped in a sterile fashion.  Local anesthesia: Topical Ethyl chloride.  With sterile technique and under real time ultrasound guidance: Trace effusion noted, 30 mg/2 mL of OrthoVisc (sodium hyaluronate) in a prefilled syringe was injected easily into the knee through a 22-gauge needle. Completed without difficulty  Advised to call if fevers/chills, erythema, induration, drainage, or persistent bleeding.  Images permanently stored and available for review in PACS.  Impression: Technically successful ultrasound guided injection.  Independent interpretation of notes and tests performed by another provider:   None.  Brief History, Exam, Impression, and Recommendations:    Primary osteoarthritis of both knees status post meniscal debridement on the left Orthovisc 3 of 4 left knee, return in 1 week for #4 of 4.  Hair loss Relatively new onset hair loss, positive hair pull test with 6 hairs removed. Patient does notice a difference when looking in the mirror and does note increasing amounts of hair in the drain and brush. Explained the possible etiologies including normal age-related hair loss, telogen effluvium, nothing to suggest the various types of alopecia. I have suggested #1 discussing with PCP, #2 adding topical minoxidil, and #3 reassurance.    ____________________________________________ Mariah Jones. Benjamin Stain, M.D., ABFM., CAQSM., AME. Primary Care and Sports Medicine [place] [facility]  Adjunct Professor of Family Medicine  [place] [employer] [facility] [employer]  Restaurant manager, fast food

## 2023-02-10 NOTE — Assessment & Plan Note (Signed)
Orthovisc 3 of 4 left knee, return in 1 week for #4 of 4.

## 2023-02-10 NOTE — Patient Instructions (Signed)
Look into telogen effluvium

## 2023-02-10 NOTE — Assessment & Plan Note (Signed)
Relatively new onset hair loss, positive hair pull test with 6 hairs removed. Patient does notice a difference when looking in the mirror and does note increasing amounts of hair in the drain and brush. Explained the possible etiologies including normal age-related hair loss, telogen effluvium, nothing to suggest the various types of alopecia. I have suggested #1 discussing with PCP, #2 adding topical minoxidil, and #3 reassurance.

## 2023-02-11 DIAGNOSIS — M170 Bilateral primary osteoarthritis of knee: Secondary | ICD-10-CM | POA: Diagnosis not present

## 2023-02-11 MED ORDER — HYALURONAN 30 MG/2ML IX SOSY
30.0000 mg | PREFILLED_SYRINGE | Freq: Once | INTRA_ARTICULAR | Status: AC
Start: 2023-02-11 — End: 2023-02-11
  Administered 2023-02-11: 30 mg via INTRA_ARTICULAR

## 2023-02-11 NOTE — Addendum Note (Signed)
Addended by: Carren Rang A on: 02/11/2023 09:22 AM   Modules accepted: Orders

## 2023-02-16 ENCOUNTER — Ambulatory Visit: Payer: BC Managed Care – PPO | Admitting: Medical-Surgical

## 2023-02-16 ENCOUNTER — Other Ambulatory Visit (HOSPITAL_BASED_OUTPATIENT_CLINIC_OR_DEPARTMENT_OTHER): Payer: Self-pay

## 2023-02-16 ENCOUNTER — Encounter: Payer: Self-pay | Admitting: Medical-Surgical

## 2023-02-16 VITALS — BP 102/69 | HR 74 | Resp 20 | Ht 66.0 in | Wt 194.5 lb

## 2023-02-16 DIAGNOSIS — R0789 Other chest pain: Secondary | ICD-10-CM

## 2023-02-16 DIAGNOSIS — E782 Mixed hyperlipidemia: Secondary | ICD-10-CM | POA: Diagnosis not present

## 2023-02-16 DIAGNOSIS — E119 Type 2 diabetes mellitus without complications: Secondary | ICD-10-CM | POA: Diagnosis not present

## 2023-02-16 DIAGNOSIS — I10 Essential (primary) hypertension: Secondary | ICD-10-CM | POA: Diagnosis not present

## 2023-02-16 LAB — POCT GLYCOSYLATED HEMOGLOBIN (HGB A1C): Hemoglobin A1C: 4.9 % (ref 4.0–5.6)

## 2023-02-16 MED ORDER — MOUNJARO 15 MG/0.5ML ~~LOC~~ SOAJ: 15.0000 mg | SUBCUTANEOUS | 3 refills | Status: DC

## 2023-02-16 MED ORDER — CHLORTHALIDONE 25 MG PO TABS: 25.0000 mg | ORAL_TABLET | Freq: Every day | ORAL | 3 refills | Status: DC

## 2023-02-16 NOTE — Progress Notes (Signed)
        Established patient visit  History, exam, impression, and plan:  1. Primary hypertension Pleasant 62 year old female presenting today with a history of hypertension.  This has most recently been managed with chlorthalidone 25 mg daily.  She has been taking the medication as prescribed, tolerating well without side effects.  Monitoring blood pressures at home and notes that her readings are most often in the 110s systolically.  Aims for a low-sodium diet.  Not regularly exercising.  Has lost approximately 71 pounds in the last year and wonders if she still needs the chlorthalidone to maintain her blood pressure.  Blood pressure at 102/69 today.  Cardiopulmonary exam normal.  Checking labs as noted below.  Refilling chlorthalidone 25 mg daily.  Instructed patient to monitor her blood pressure at home and if consistently lower or she develops orthostasis, reduce the medication to 12.5 mg daily.  If her blood pressure continues to run low, she may consider stopping the medication completely but will need to monitor her blood pressure closely.  At that point, if it should creep up, I would recommend restarting at the half dose.  Patient verbalized understanding of the above instructions and is agreeable. - CMP14+EGFR - Lipid panel  2. Mixed hyperlipidemia She does have a history of hyperlipidemia and we have discussed starting a statin medication in the past for cardiovascular protection, cholesterol management, etc.  She has been hesitant to consider doing so and would like to see what lifestyle/dietary changes and weight loss has done to help her cholesterol.  Rechecking labs today. - CMP14+EGFR - Lipid panel  3. Morbid obesity (HCC) Resolved.  Since starting [facility], she has lost 71 pounds as noted above.  BMI is dual elevated but is now in the class I obesity range.  4. Controlled type 2 diabetes mellitus without complication, without long-term current use of insulin (HCC) She is currently  using Mounjaro 15 mg weekly, tolerating well.  She does have constipation but is not sure if this is related to her small dietary intake or the medication.  She uses over-the-counter medications to help with constipation.  Notes that the medication no longer causes her to feel extremely full early on in her meals but she is still following healthy dietary changes, carbohydrate restriction, and adequate hydration instructions.  POCT hemoglobin A1c 5.4% 6 months ago.  Recheck today at 4.9% indicating that she is extremely well-controlled.  Continue Mounjaro 15 mg daily. - POCT HgB A1C - tirzepatide (MOUNJARO) 15 MG/0.5ML Pen; Inject 15 mg into the skin once a week.  Dispense: 6 mL; Refill: 3  5. Atypical chest pain Has had some very intermittent short-lived atypical substernal chest pain that is not reproducible on palpation.  This is very random and not associated with particular activities or dietary intake.  Has no accompanying symptoms such as diaphoresis, nausea, vomiting, shortness of breath, or dizziness.  Saw a cardiologist when she lived in [place] who followed up with her every 6 months due to her family history of cardiovascular disease.  Is interested in getting a referral to cardiology to establish with a provider here to resume close monitoring.  Referral to cardiology placed. - Ambulatory referral to Cardiology   Procedures performed this visit: None.  Return in about 6 months (around 08/17/2023) for DM/HTN/HLD follow up.  __________________________________ Thayer Ohm, DNP, APRN, FNP-BC Primary Care and Sports Medicine [facility]

## 2023-02-17 ENCOUNTER — Other Ambulatory Visit: Payer: Self-pay | Admitting: Medical-Surgical

## 2023-02-17 ENCOUNTER — Other Ambulatory Visit (INDEPENDENT_AMBULATORY_CARE_PROVIDER_SITE_OTHER): Payer: BC Managed Care – PPO

## 2023-02-17 ENCOUNTER — Ambulatory Visit: Payer: BC Managed Care – PPO | Admitting: Sports Medicine

## 2023-02-17 ENCOUNTER — Encounter: Payer: Self-pay | Admitting: Medical-Surgical

## 2023-02-17 ENCOUNTER — Other Ambulatory Visit (HOSPITAL_BASED_OUTPATIENT_CLINIC_OR_DEPARTMENT_OTHER): Payer: Self-pay

## 2023-02-17 DIAGNOSIS — M170 Bilateral primary osteoarthritis of knee: Secondary | ICD-10-CM | POA: Diagnosis not present

## 2023-02-17 DIAGNOSIS — M5136 Other intervertebral disc degeneration, lumbar region with discogenic back pain only: Secondary | ICD-10-CM

## 2023-02-17 DIAGNOSIS — E119 Type 2 diabetes mellitus without complications: Secondary | ICD-10-CM

## 2023-02-17 LAB — CMP14+EGFR
ALT: 11 [IU]/L (ref 0–32)
AST: 15 [IU]/L (ref 0–40)
Albumin: 4.2 g/dL (ref 3.9–4.9)
Alkaline Phosphatase: 59 [IU]/L (ref 44–121)
BUN/Creatinine Ratio: 24 (ref 12–28)
BUN: 17 mg/dL (ref 8–27)
Bilirubin Total: 1.6 mg/dL — ABNORMAL HIGH (ref 0.0–1.2)
CO2: 29 mmol/L (ref 20–29)
Calcium: 9.8 mg/dL (ref 8.7–10.3)
Chloride: 95 mmol/L — ABNORMAL LOW (ref 96–106)
Creatinine, Ser: 0.7 mg/dL (ref 0.57–1.00)
Globulin, Total: 2.5 g/dL (ref 1.5–4.5)
Glucose: 72 mg/dL (ref 70–99)
Potassium: 3.4 mmol/L — ABNORMAL LOW (ref 3.5–5.2)
Sodium: 140 mmol/L (ref 134–144)
Total Protein: 6.7 g/dL (ref 6.0–8.5)
eGFR: 98 mL/min/{1.73_m2} (ref >59)

## 2023-02-17 LAB — LIPID PANEL
Chol/HDL Ratio: 6.9 ratio — ABNORMAL HIGH (ref 0.0–4.4)
Cholesterol, Total: 249 mg/dL — ABNORMAL HIGH (ref 100–199)
HDL: 36 mg/dL — ABNORMAL LOW (ref >39)
LDL Chol Calc (NIH): 185 mg/dL — ABNORMAL HIGH (ref 0–99)
Triglycerides: 152 mg/dL — ABNORMAL HIGH (ref 0–149)
VLDL Cholesterol Cal: 28 mg/dL (ref 5–40)

## 2023-02-17 MED ORDER — MOUNJARO 15 MG/0.5ML ~~LOC~~ SOAJ
15.0000 mg | SUBCUTANEOUS | 3 refills | Status: DC
  Filled 2023-02-17: qty 2, 28d supply, fill #0
  Filled 2023-03-20: qty 2, 28d supply, fill #1
  Filled 2023-04-08 – 2023-04-12 (×2): qty 2, 28d supply, fill #2
  Filled 2023-05-13: qty 2, 28d supply, fill #3
  Filled 2023-06-10: qty 2, 28d supply, fill #4
  Filled 2023-06-22 (×2): qty 2, 28d supply, fill #5
  Filled 2023-07-26: qty 2, 28d supply, fill #6
  Filled 2023-08-20: qty 2, 28d supply, fill #7
  Filled 2023-09-17 (×2): qty 2, 28d supply, fill #8
  Filled 2023-11-05: qty 2, 28d supply, fill #9
  Filled 2023-11-29: qty 2, 28d supply, fill #10
  Filled 2023-12-20: qty 2, 28d supply, fill #11

## 2023-02-17 MED ORDER — LOVASTATIN 10 MG PO TABS: 10.0000 mg | ORAL_TABLET | Freq: Every day | ORAL | 1 refills | Status: DC

## 2023-02-17 NOTE — Assessment & Plan Note (Signed)
Orthovisc 4 of 4 left knee, not having much pain but is having a lot of crepitus and some weakness. We will give this about 6 weeks after the last injection to see how things are going. We could always consider geniculate artery embolization.

## 2023-02-17 NOTE — Assessment & Plan Note (Signed)
Having a recurrence of back pain, she has multilevel facet arthritis L3-L5 and disc disease, she did really well with a left L4-L5 interlaminar epidural, repeating this per her request.

## 2023-02-17 NOTE — Progress Notes (Signed)
    Procedures performed today:    Procedure: Real-time Ultrasound Guided injection of the left knee Device: Samsung HS60  Verbal informed consent obtained.  Time-out conducted.  Noted no overlying erythema, induration, or other signs of local infection.  Skin prepped in a sterile fashion.  Local anesthesia: Topical Ethyl chloride.  With sterile technique and under real time ultrasound guidance: Trace effusion noted, 30 mg/2 mL of OrthoVisc (sodium hyaluronate) in a prefilled syringe was injected easily into the knee through a 22-gauge needle. Completed without difficulty  Advised to call if fevers/chills, erythema, induration, drainage, or persistent bleeding.  Images permanently stored and available for review in PACS.  Impression: Technically successful ultrasound guided injection.  Independent interpretation of notes and tests performed by another provider:   None.  Brief History, Exam, Impression, and Recommendations:    Lumbar degenerative disc disease Having a recurrence of back pain, she has multilevel facet arthritis L3-L5 and disc disease, she did really well with a left L4-L5 interlaminar epidural, repeating this per her request.  Primary osteoarthritis of both knees status post meniscal debridement on the left Orthovisc 4 of 4 left knee, not having much pain but is having a lot of crepitus and some weakness. We will give this about 6 weeks after the last injection to see how things are going. We could always consider geniculate artery embolization.    ____________________________________________ Ihor Austin. Benjamin Stain, M.D., ABFM., CAQSM., AME. Primary Care and Sports Medicine [facility] [facility]  Adjunct Professor of Family Medicine  [place] [employer] [facility] [employer]  Restaurant manager, fast food

## 2023-02-18 ENCOUNTER — Other Ambulatory Visit (HOSPITAL_BASED_OUTPATIENT_CLINIC_OR_DEPARTMENT_OTHER): Payer: Self-pay

## 2023-02-18 DIAGNOSIS — M170 Bilateral primary osteoarthritis of knee: Secondary | ICD-10-CM | POA: Diagnosis not present

## 2023-02-18 MED ORDER — HYALURONAN 30 MG/2ML IX SOSY
30.0000 mg | PREFILLED_SYRINGE | Freq: Once | INTRA_ARTICULAR | Status: AC
  Administered 2023-02-18: 30 mg via INTRA_ARTICULAR

## 2023-02-18 NOTE — Addendum Note (Signed)
Addended by: Carren Rang A on: 02/18/2023 01:58 PM   Modules accepted: Orders

## 2023-02-24 IMAGING — DX DG HAND COMPLETE 3+V*R*
3 series · 3 of 3 positions shown · non-contrast
Comparison: None Available.

CLINICAL DATA: Right hand first CMC pain.

EXAM:
RIGHT HAND - COMPLETE 3+ VIEW

[hand pa]
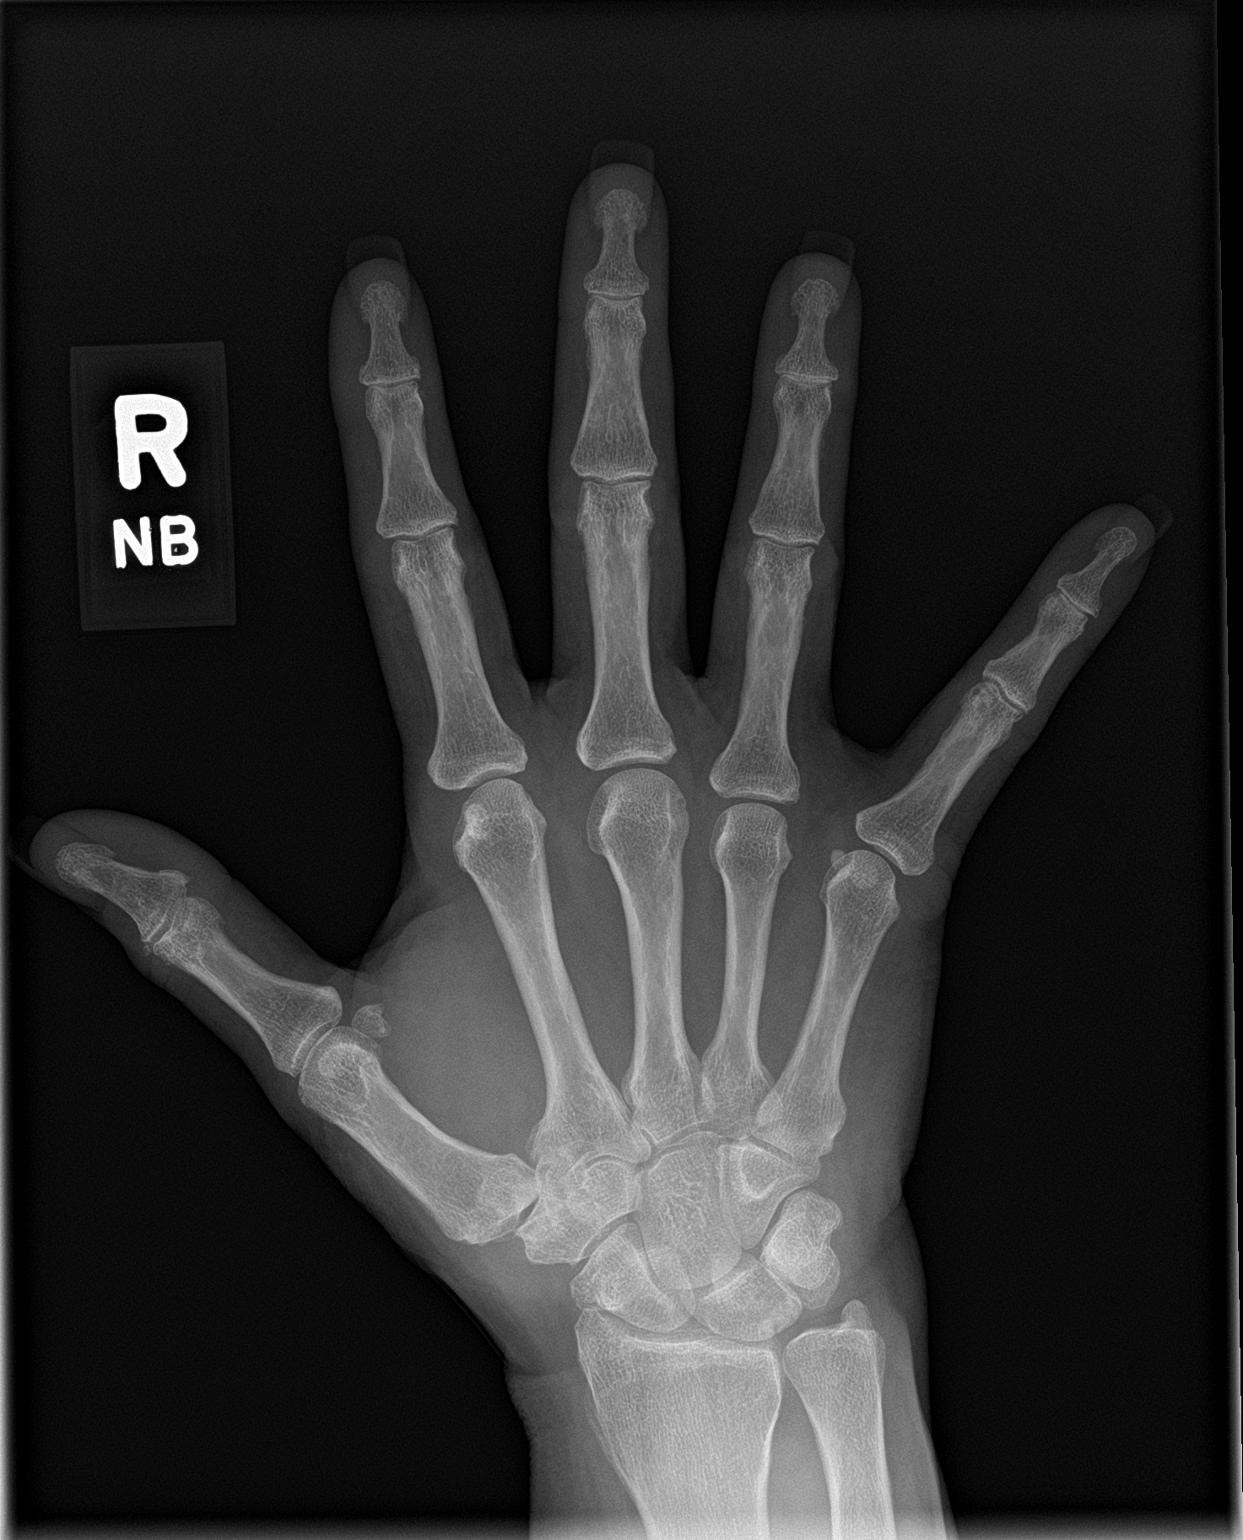

[hand lat]
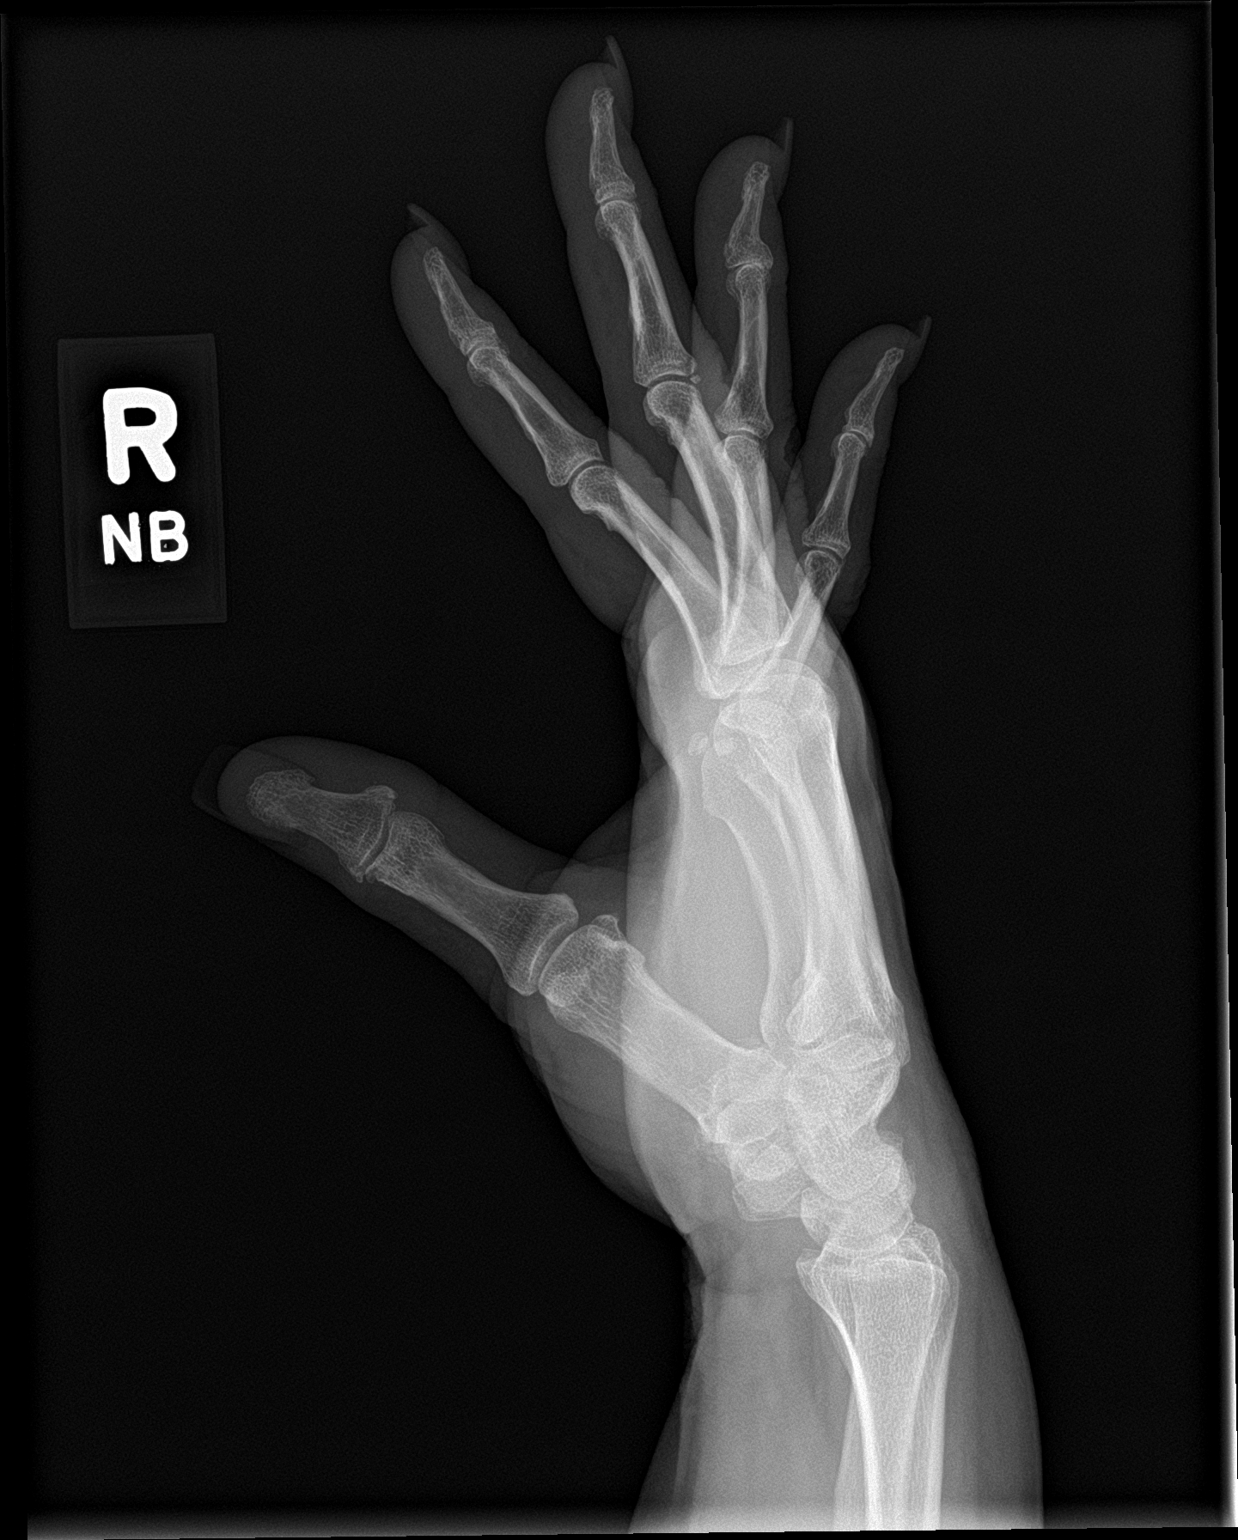

[hand obl]
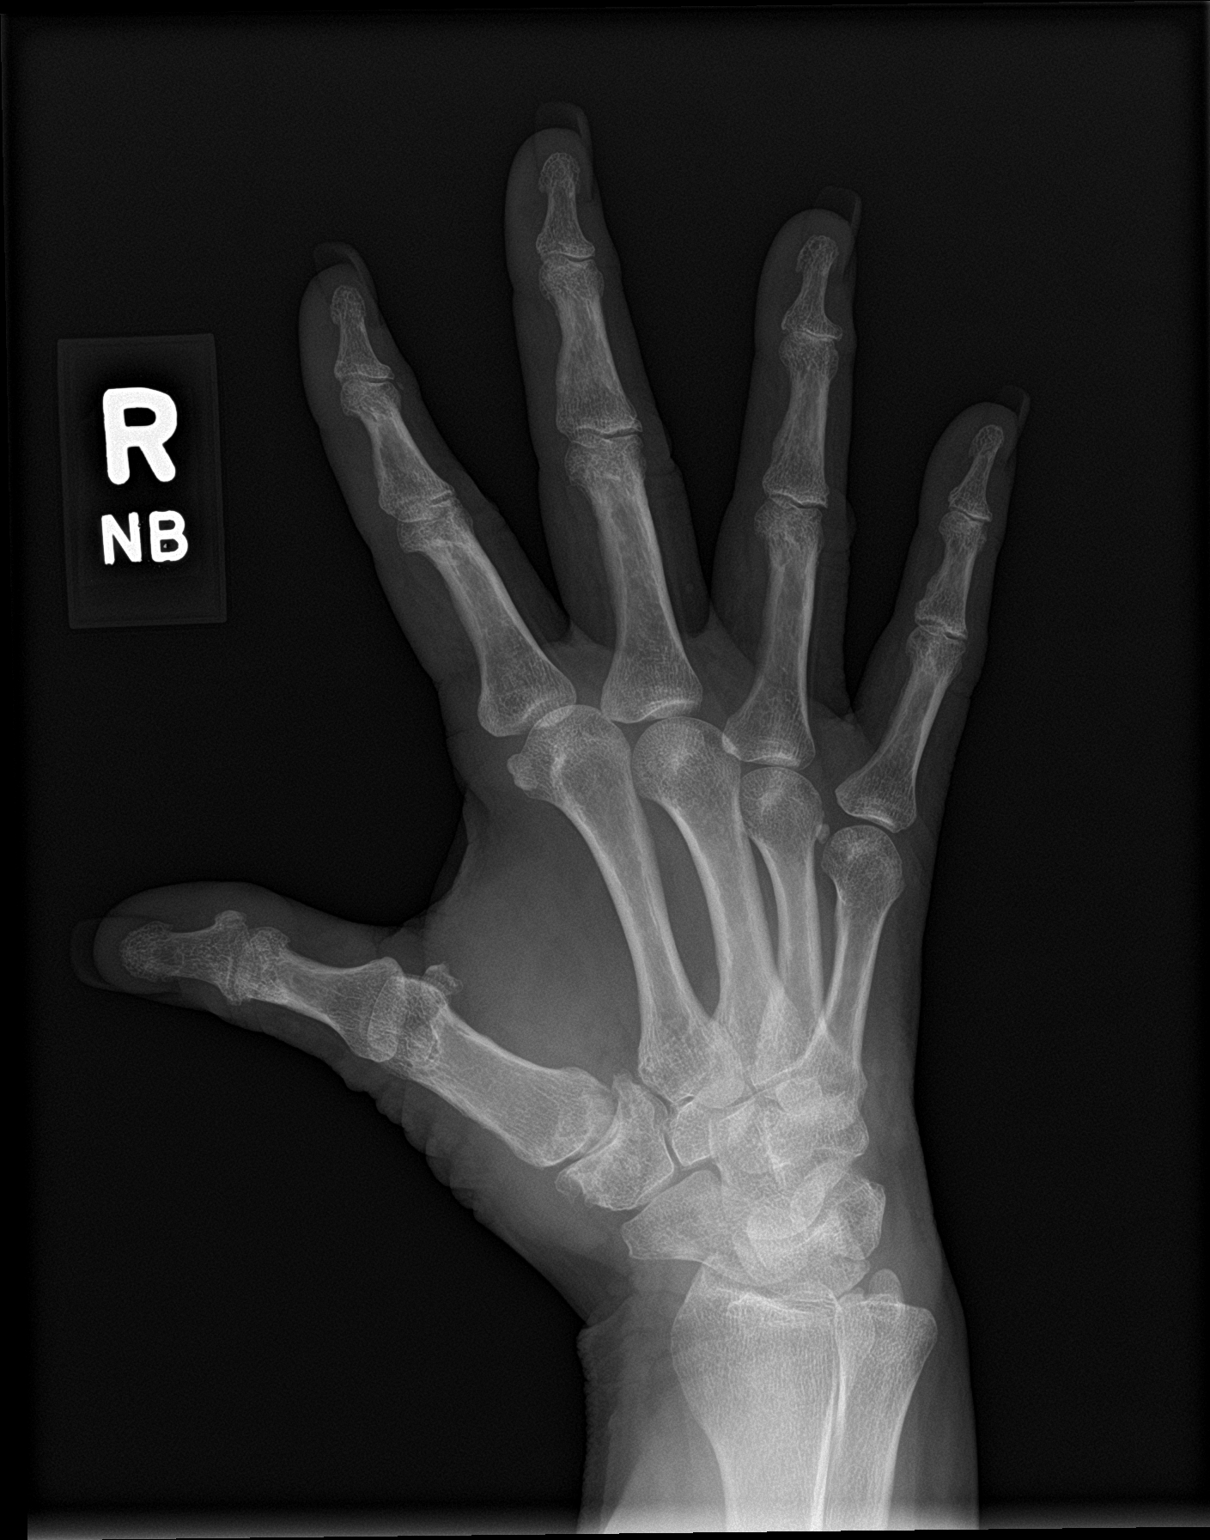

[3 of 3 positions shown; findings below may reference images not displayed]

FINDINGS: No acute fracture or dislocation. The bones are well mineralized.
Mild arthritic changes of the base of the thumb. The soft tissues
are unremarkable.
IMPRESSION: 1. No acute fracture or dislocation.
2. Mild arthritic changes of the base of the thumb.

## 2023-02-24 IMAGING — MG MM DIGITAL SCREENING BILAT W/ TOMO AND CAD
8 series · 8 of 24 positions shown · non-contrast
Comparison: Previous exam(s).

CLINICAL DATA: Screening.

EXAM:
DIGITAL SCREENING BILATERAL MAMMOGRAM WITH TOMOSYNTHESIS AND CAD
TECHNIQUE: Bilateral screening digital craniocaudal and mediolateral oblique
mammograms were obtained. Bilateral screening digital breast
tomosynthesis was performed. The images were evaluated with
computer-aided detection.

[R MLO synth-2D]
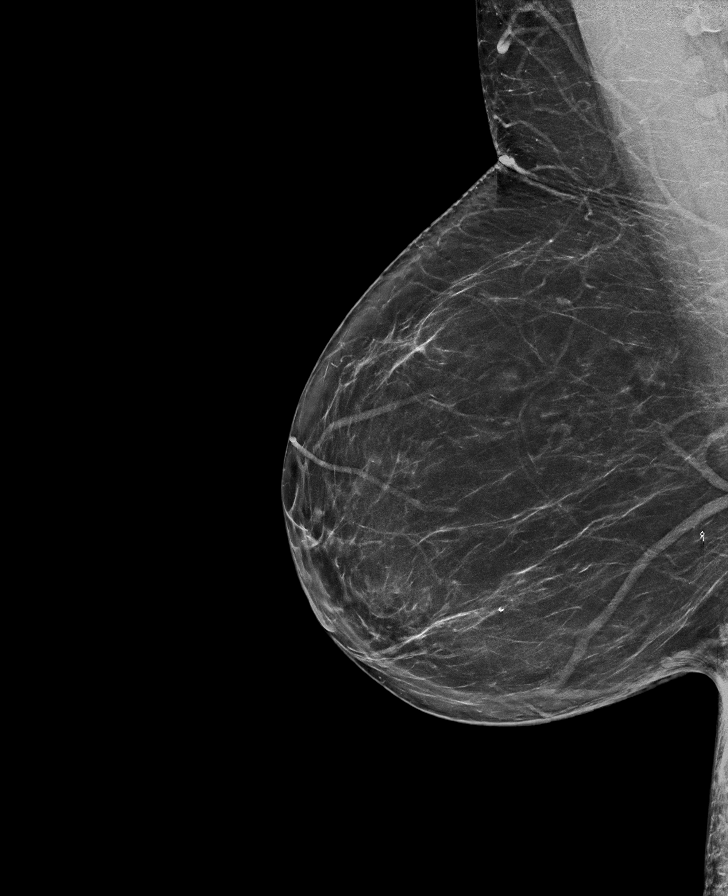

[R CC synth-2D]
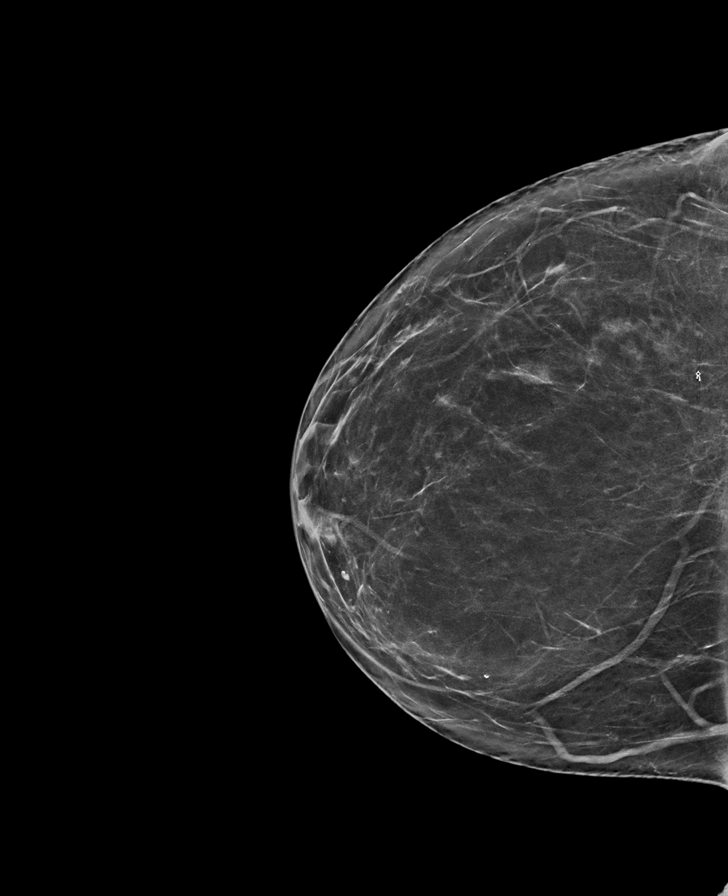

[L MLO synth-2D]
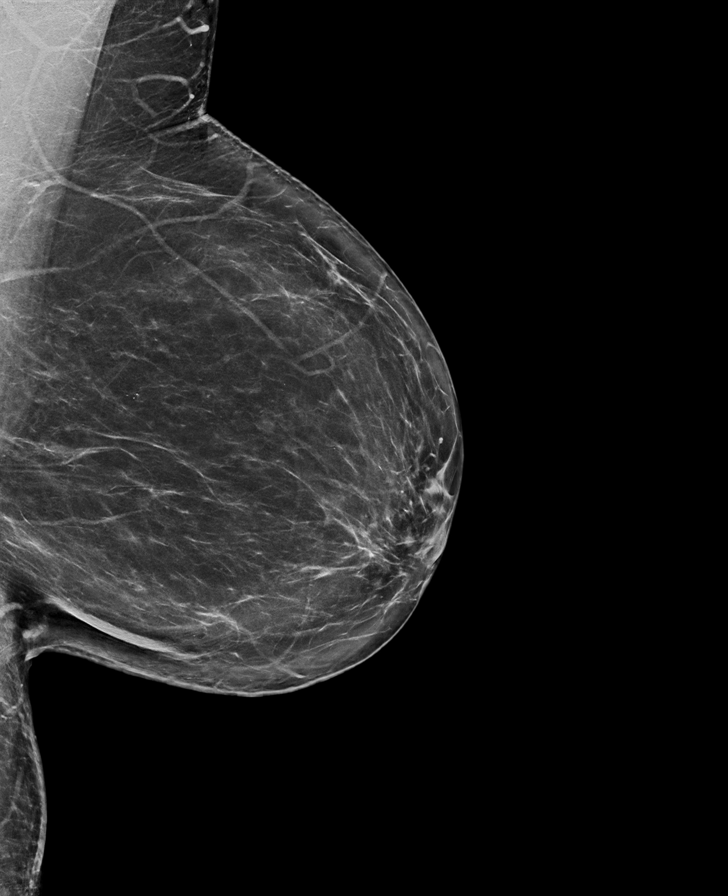

[L CC synth-2D]
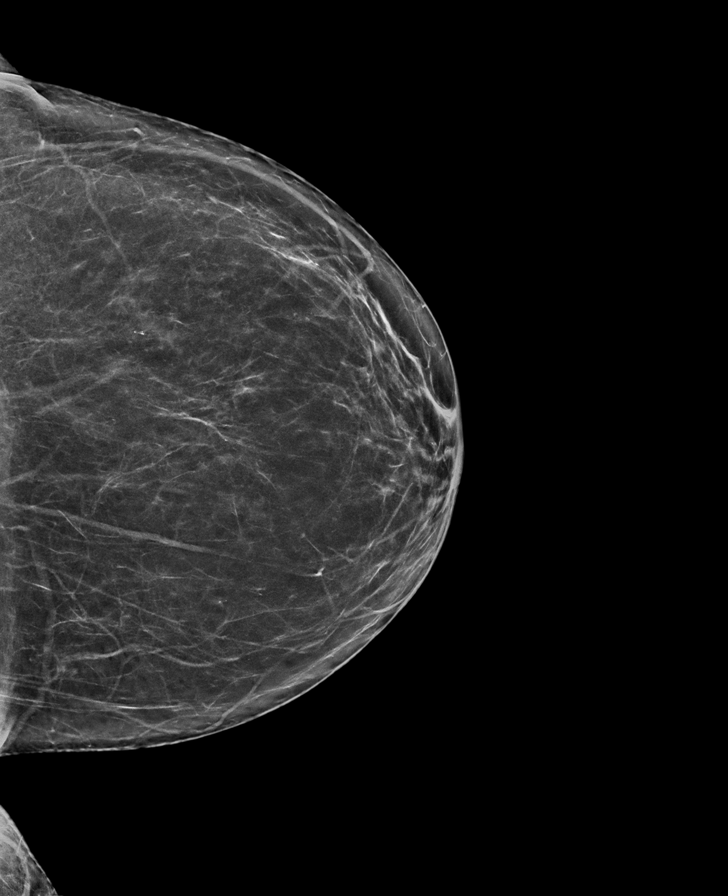

[R CC tomo · tomo slice 36/71.0]
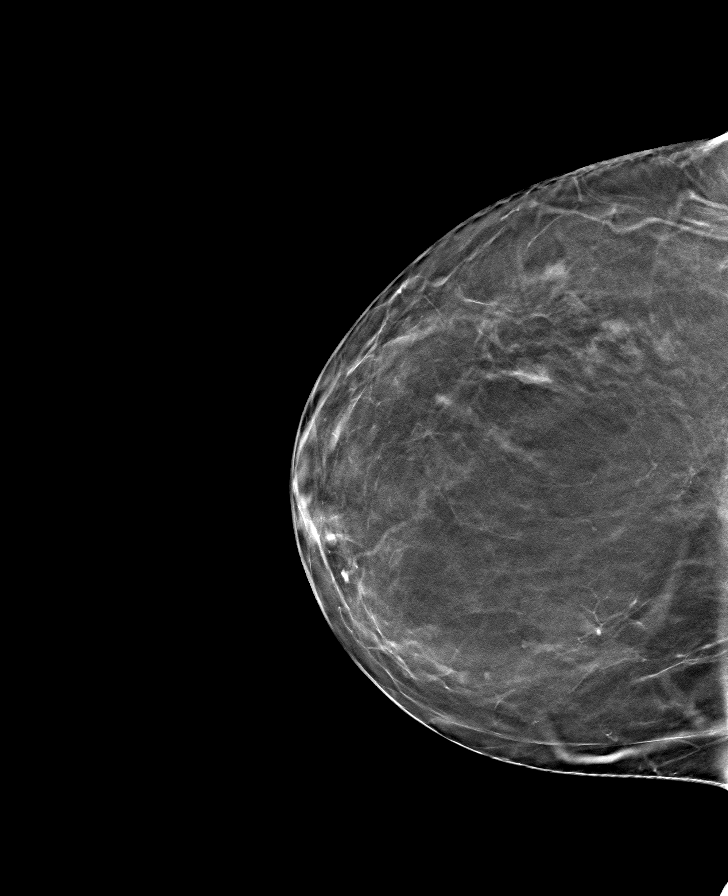

[L MLO tomo · tomo slice 41/81.0]
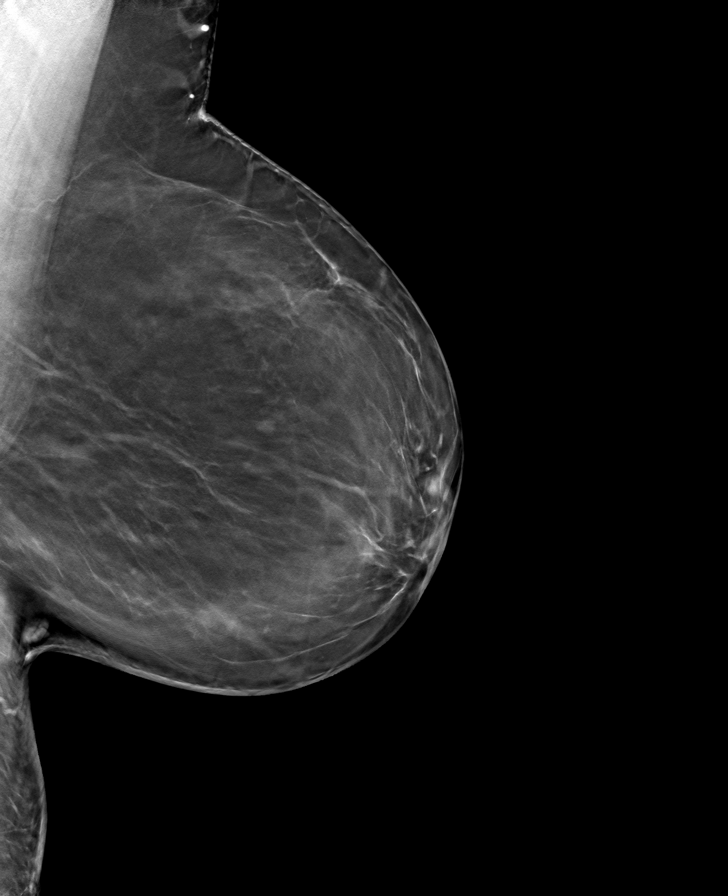

[R MLO tomo · tomo slice 41/80.0]
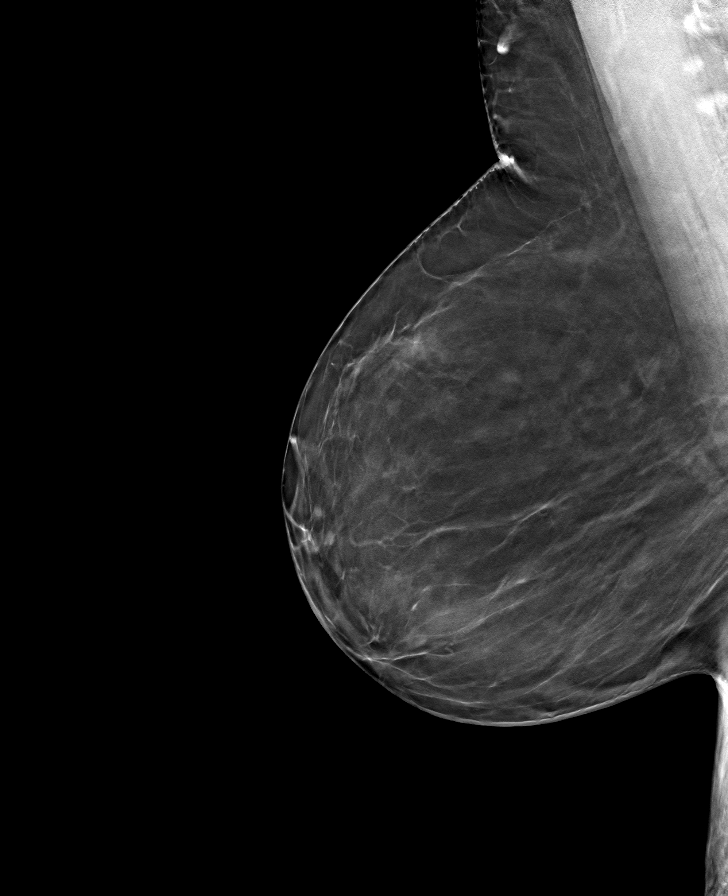

[L CC tomo · tomo slice 36/71.0]
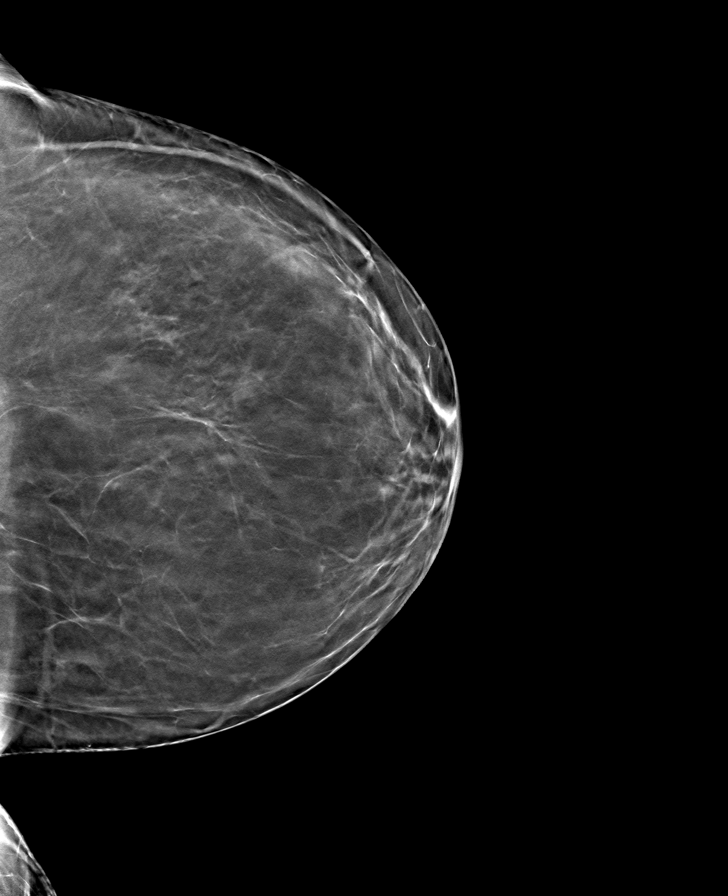

[8 of 24 positions shown; findings below may reference images not displayed]

ACR Breast Density Category b: There are scattered areas of
fibroglandular density.
FINDINGS: There are no findings suspicious for malignancy.
IMPRESSION: No mammographic evidence of malignancy. A result letter of this
screening mammogram will be mailed directly to the patient.

RECOMMENDATION:
Screening mammogram in one year. (Code:51-O-LD2)

BI-RADS CATEGORY  1: Negative.

## 2023-03-08 ENCOUNTER — Ambulatory Visit: Payer: BC Managed Care – PPO

## 2023-03-11 ENCOUNTER — Encounter: Payer: Self-pay | Admitting: Sports Medicine

## 2023-03-17 ENCOUNTER — Other Ambulatory Visit: Payer: BC Managed Care – PPO

## 2023-03-18 ENCOUNTER — Encounter: Payer: Self-pay | Admitting: Medical-Surgical

## 2023-03-18 NOTE — Telephone Encounter (Signed)
 Care team updated and letter sent for eye exam notes.

## 2023-03-21 ENCOUNTER — Encounter: Payer: Self-pay | Admitting: Medical-Surgical

## 2023-03-21 DIAGNOSIS — E119 Type 2 diabetes mellitus without complications: Secondary | ICD-10-CM

## 2023-03-22 ENCOUNTER — Other Ambulatory Visit: Payer: Self-pay

## 2023-03-22 NOTE — Discharge Instructions (Signed)

## 2023-03-23 ENCOUNTER — Ambulatory Visit
Admission: RE | Admit: 2023-03-23 | Discharge: 2023-03-23 | Disposition: A | Discharge status: Home / Self Care | Payer: BC Managed Care – PPO | Source: Ambulatory Visit | Attending: Sports Medicine | Admitting: Sports Medicine

## 2023-03-23 DIAGNOSIS — M5136 Other intervertebral disc degeneration, lumbar region with discogenic back pain only: Secondary | ICD-10-CM

## 2023-03-23 MED ORDER — METHYLPREDNISOLONE ACETATE 40 MG/ML INJ SUSP (RADIOLOG
80.0000 mg | Freq: Once | INTRAMUSCULAR | Status: AC
Start: 2023-03-23 — End: 2023-03-23
  Administered 2023-03-23: 80 mg via EPIDURAL

## 2023-03-23 MED ORDER — IOPAMIDOL (ISOVUE-M 200) INJECTION 41%
1.0000 mL | Freq: Once | INTRAMUSCULAR | Status: AC
  Administered 2023-03-23: 1 mL via EPIDURAL

## 2023-04-03 ENCOUNTER — Encounter (INDEPENDENT_AMBULATORY_CARE_PROVIDER_SITE_OTHER): Payer: Self-pay | Admitting: Sports Medicine

## 2023-04-03 DIAGNOSIS — M5136 Other intervertebral disc degeneration, lumbar region with discogenic back pain only: Secondary | ICD-10-CM | POA: Diagnosis not present

## 2023-04-05 NOTE — Telephone Encounter (Signed)

## 2023-04-05 NOTE — Assessment & Plan Note (Signed)
Known lumbar DDD, she has multilevel facet arthritis L3-L5 and disc disease, did well with a left L4-L5 interlaminar epidural previously, more recently a right L4-L5 interlaminar epidural did not provide good relief, switching to transforaminal, if this fails we will try the facets.

## 2023-04-06 ENCOUNTER — Ambulatory Visit
Admission: RE | Admit: 2023-04-06 | Discharge: 2023-04-06 | Disposition: A | Discharge status: Home / Self Care | Payer: BC Managed Care – PPO | Source: Ambulatory Visit | Attending: Sports Medicine | Admitting: Sports Medicine

## 2023-04-06 DIAGNOSIS — M5136 Other intervertebral disc degeneration, lumbar region with discogenic back pain only: Secondary | ICD-10-CM

## 2023-04-06 MED ORDER — IOPAMIDOL (ISOVUE-M 200) INJECTION 41%
1.0000 mL | Freq: Once | INTRAMUSCULAR | Status: AC
  Administered 2023-04-06: 1 mL via EPIDURAL

## 2023-04-06 MED ORDER — METHYLPREDNISOLONE ACETATE 40 MG/ML INJ SUSP (RADIOLOG
80.0000 mg | Freq: Once | INTRAMUSCULAR | Status: AC
Start: 2023-04-06 — End: 2023-04-06
  Administered 2023-04-06: 80 mg via EPIDURAL

## 2023-04-06 NOTE — Discharge Instructions (Signed)

## 2023-04-07 ENCOUNTER — Other Ambulatory Visit: Payer: Self-pay

## 2023-04-07 DIAGNOSIS — I872 Venous insufficiency (chronic) (peripheral): Secondary | ICD-10-CM | POA: Insufficient documentation

## 2023-04-07 DIAGNOSIS — R87619 Unspecified abnormal cytological findings in specimens from cervix uteri: Secondary | ICD-10-CM | POA: Insufficient documentation

## 2023-04-07 DIAGNOSIS — E079 Disorder of thyroid, unspecified: Secondary | ICD-10-CM | POA: Insufficient documentation

## 2023-04-07 DIAGNOSIS — K635 Polyp of colon: Secondary | ICD-10-CM | POA: Insufficient documentation

## 2023-04-08 ENCOUNTER — Encounter: Payer: Self-pay | Admitting: Cardiology

## 2023-04-08 ENCOUNTER — Other Ambulatory Visit (HOSPITAL_BASED_OUTPATIENT_CLINIC_OR_DEPARTMENT_OTHER): Payer: Self-pay

## 2023-04-08 ENCOUNTER — Telehealth (HOSPITAL_COMMUNITY): Payer: Self-pay

## 2023-04-08 ENCOUNTER — Encounter: Payer: Self-pay | Admitting: Medical-Surgical

## 2023-04-08 ENCOUNTER — Ambulatory Visit: Payer: BC Managed Care – PPO | Attending: Cardiology | Admitting: Cardiology

## 2023-04-08 VITALS — BP 123/74 | HR 71 | Ht 66.6 in | Wt 189.0 lb

## 2023-04-08 DIAGNOSIS — R011 Cardiac murmur, unspecified: Secondary | ICD-10-CM | POA: Insufficient documentation

## 2023-04-08 DIAGNOSIS — I10 Essential (primary) hypertension: Secondary | ICD-10-CM

## 2023-04-08 DIAGNOSIS — E119 Type 2 diabetes mellitus without complications: Secondary | ICD-10-CM | POA: Diagnosis not present

## 2023-04-08 DIAGNOSIS — E782 Mixed hyperlipidemia: Secondary | ICD-10-CM | POA: Diagnosis not present

## 2023-04-08 DIAGNOSIS — R079 Chest pain, unspecified: Secondary | ICD-10-CM | POA: Insufficient documentation

## 2023-04-08 NOTE — Progress Notes (Signed)
Cardiology Office Note:    Date:  04/08/2023   ID:  Mariah Jones, DOB February 07, 1961, MRN [id number]  PCP:  Christen Butter, NP  Cardiologist:  Garwin Brothers, MD   Referring MD: Christen Butter, NP    ASSESSMENT:    1. Mixed hyperlipidemia   2. Primary hypertension   3. Controlled type 2 diabetes mellitus without complication, without long-term current use of insulin (HCC)   4. Morbid obesity (HCC)   5. Chest pain of uncertain etiology   6. Cardiac murmur   7. Mixed dyslipidemia    PLAN:    In order of problems listed above:  Primary prevention stressed with the patient.  Importance of compliance with diet medication stressed and patient verbalized standing. Chest pain: Atypical in nature but in view of risk factors we will do a Lexiscan sestamibi.  She is unable to walk significantly because of knee issues and her orthopedic problems. Cardiac murmur: Echocardiogram will be done to assess murmur heard on auscultation. Essential hypertension: Blood pressure is stable and diet was emphasized.  Lifestyle modification and salt intake issues were discussed. Obesity: Weight reduction stressed and she is promising to do better Mixed dyslipidemia: Markedly elevated LDL.  I suggested calcium scoring to risk stratify more aggressively and possible targeted therapy.  She is agreeable. Patient will be seen in follow-up appointment in 6 months or earlier if the patient has any concerns.    Medication Adjustments/Labs and Tests Ordered: Current medicines are reviewed at length with the patient today.  Concerns regarding medicines are outlined above.  Orders Placed This Encounter  Procedures   EKG 12-Lead   No orders of the defined types were placed in this encounter.    History of Present Illness:    Mariah Jones is a 61 y.o. female who is being seen today for the evaluation of chest pain at the request of Christen Butter, NP.  Patient is a pleasant 62 year old female.  She has past  medical history of essential hypertension, diabetes mellitus.  She had morbid obesity but is better now since she is taking the [facility].  No orthopnea or PND.  She leads a sedentary lifestyle because of significant orthopedic issues involving the left knee.  At the time of my evaluation, the patient is alert awake oriented and in no distress.  She wants to evaluate and get restratification from coronary perspective.  She has chest pain at times lasting a couple of seconds stabbing in nature no radiation to the neck or to the arms.  Past Medical History:  Diagnosis Date   Abnormal Pap smear of cervix    Acute medial meniscus tear 07/15/2021   Chronic low back pain 07/15/2021   Colon polyps    Controlled type 2 diabetes mellitus without complication, without long-term current use of insulin (HCC) 04/06/2022   Hair loss 02/10/2023   History of asthma 12/03/2021   HTN (hypertension) 07/15/2021   Hyperlipidemia 07/15/2021   Lumbar degenerative disc disease 07/21/2021   Morbid obesity (HCC) 02/20/2022   Other specified glaucoma 07/15/2021   Polyarthralgia with hyperuricemia 02/20/2022   Primary osteoarthritis of both first carpometacarpal joints 08/28/2021   Primary osteoarthritis of both knees status post meniscal debridement on the left 06/06/2021   Primary osteoarthritis of right shoulder 10/22/2022   Recurrent UTI 08/11/2022   Thyroid disease    Venous insufficiency     Past Surgical History:  Procedure Laterality Date   ABLATION     BREAST EXCISIONAL BIOPSY Left  Age 19   TOTAL ABDOMINAL HYSTERECTOMY      Current Medications: Current Meds  Medication Sig   chlorthalidone (HYGROTON) 25 MG tablet Take 1 tablet (25 mg total) by mouth daily.   TIMOLOL MALEATE, ONCE-DAILY, OP Apply 1 drop to eye daily.   tirzepatide (MOUNJARO) 15 MG/0.5ML Pen Inject 15 mg into the skin once a week.     Allergies:   Levaquin [levofloxacin], Meperidine hcl, Morphine, Tramadol, Acetaminophen,  Codeine, and Sulfamethoxazole-trimethoprim   Social History   Socioeconomic History   Marital status: Married    Spouse name: Not on file   Number of children: Not on file   Years of education: Not on file   Highest education level: Bachelor's degree (e.g., BA, AB, BS)  Occupational History   Not on file  Tobacco Use   Smoking status: Former    Current packs/day: 0.00    Types: Cigarettes    Quit date: 1987    Years since quitting: 37.9   Smokeless tobacco: Never  Vaping Use   Vaping status: Never Used  Substance and Sexual Activity   Alcohol use: Not Currently   Drug use: Never   Sexual activity: Yes    Partners: Male    Birth control/protection: Surgical, Post-menopausal  Other Topics Concern   Not on file  Social History Narrative   Not on file   Social Drivers of Health   Financial Resource Strain: Low Risk  (02/06/2023)   Overall Financial Resource Strain (CARDIA)    Difficulty of Paying Living Expenses: Not hard at all  Food Insecurity: No Food Insecurity (02/06/2023)   Hunger Vital Sign    Worried About Running Out of Food in the Last Year: Never true    Ran Out of Food in the Last Year: Never true  Transportation Needs: Patient Declined (02/06/2023)   PRAPARE - Administrator, Civil Service (Medical): Patient declined    Lack of Transportation (Non-Medical): Patient declined  Physical Activity: Unknown (09/21/2022)   [facility] from [facility], [facility]   Exercise Vital Sign    Days of Exercise per Week: 0 days    Minutes of Exercise per Session: Not on file  Stress: No Stress Concern Present (09/21/2022)   Received from [facility], [facility]   [employer] of Occupational Health - Occupational Stress Questionnaire    Feeling of Stress : Not at all  Social Connections: Unknown (02/06/2023)   Social Connection and Isolation Panel [NHANES]    Frequency of Communication with Friends and Family: More than three times a week     Frequency of Social Gatherings with Friends and Family: Patient declined    Attends Religious Services: Patient declined    Database administrator or Organizations: Patient declined    Attends Engineer, structural: Not on file    Marital Status: Patient declined     Family History: The patient's family history includes Cancer in an other family member; Colon cancer in an other family member; Diabetes in an other family member; Heart attack in an other family member; Hypertension in an other family member; Prostate cancer in an other family member; Skin cancer in an other family member; Stroke in an other family member.  ROS:   Please see the history of present illness.    All other systems reviewed and are negative.  EKGs/Labs/Other Studies Reviewed:    The following studies were reviewed today: .Marland KitchenEKG Interpretation Date/Time:  Thursday April 08 2023 10:37:32 EST Ventricular  Rate:  71 PR Interval:  218 QRS Duration:  80 QT Interval:  404 QTC Calculation: 439 R Axis:   59  Text Interpretation: Sinus rhythm with 1st degree A-V block Anterior infarct , age undetermined No previous ECGs available Confirmed by Belva Crome [id number]) on 04/08/2023 10:54:42 AM   EKG Interpretation Date/Time:  Thursday April 08 2023 10:37:32 EST Ventricular Rate:  71 PR Interval:  218 QRS Duration:  80 QT Interval:  404 QTC Calculation: 439 R Axis:   59  Text Interpretation: Sinus rhythm with 1st degree A-V block Anterior infarct , age undetermined No previous ECGs available Confirmed by Belva Crome [id number]) on 04/08/2023 10:54:42 AM     Recent Labs: 06/29/2022: Hemoglobin 15.4; Platelets 341; TSH 3.14 02/16/2023: ALT 11; BUN 17; Creatinine, Ser 0.70; Potassium 3.4; Sodium 140  Recent Lipid Panel    Component Value Date/Time   CHOL 249 (H) 02/16/2023 1140   TRIG 152 (H) 02/16/2023 1140   HDL 36 (L) 02/16/2023 1140   CHOLHDL 6.9 (H) 02/16/2023 1140   CHOLHDL 6.3 (H)  06/29/2022 0940   LDLCALC 185 (H) 02/16/2023 1140   LDLCALC 146 (H) 06/29/2022 0940    Physical Exam:    VS:  BP 123/74   Pulse 71   Ht 5' 6.6" (1.692 m)   Wt 189 lb 0.6 oz (85.7 kg)   SpO2 96%   BMI 29.96 kg/m     Wt Readings from Last 3 Encounters:  04/08/23 189 lb 0.6 oz (85.7 kg)  02/16/23 194 lb 8 oz (88.2 kg)  08/28/22 229 lb 8 oz (104.1 kg)     GEN: Patient is in no acute distress HEENT: Normal NECK: No JVD; No carotid bruits LYMPHATICS: No lymphadenopathy CARDIAC: S1 S2 regular, 2/6 systolic murmur at the apex. RESPIRATORY:  Clear to auscultation without rales, wheezing or rhonchi  ABDOMEN: Soft, non-tender, non-distended MUSCULOSKELETAL:  No edema; No deformity  SKIN: Warm and dry NEUROLOGIC:  Alert and oriented x 3 PSYCHIATRIC:  Normal affect    Signed, Garwin Brothers, MD  04/08/2023 11:16 AM    [facility]

## 2023-04-08 NOTE — Patient Instructions (Signed)
Medication Instructions:  Your physician recommends that you continue on your current medications as directed. Please refer to the Current Medication list given to you today.   *If you need a refill on your cardiac medications before your next appointment, please call your pharmacy*   Lab Work: None ordered If you have labs (blood work) drawn today and your tests are completely normal, you will receive your results only by: MyChart Message (if you have MyChart) OR A paper copy in the mail If you have any lab test that is abnormal or we need to change your treatment, we will call you to review the results.   Testing/Procedures:   [facility] at [facility] [address], [address] [place], [place] [address] Phone: [phone]    Please arrive 15 minutes prior to your appointment time for registration and insurance purposes.  The test will take approximately 3 to 4 hours to complete; you may bring reading material.  If someone comes with you to your appointment, they will need to remain in the main lobby due to limited space in the testing area. **If you are pregnant or breastfeeding, please notify the nuclear lab prior to your appointment**  How to prepare for your Myocardial Perfusion Test: Do not eat or drink 3 hours prior to your test, except you may have water. Do not consume products containing caffeine (regular or decaffeinated) 12 hours prior to your test. (ex: coffee, chocolate, sodas, tea). Do bring a list of your current medications with you.  If not listed below, you may take your medications as normal. Do wear comfortable clothes (no dresses or overalls) and walking shoes, tennis shoes preferred (No heels or open toe shoes are allowed). Do NOT wear cologne, perfume, aftershave, or lotions (deodorant is allowed). If these instructions are not followed, your test will have to be rescheduled.  If you cannot keep your appointment, please  provide 24 hours notification to the Nuclear Lab, to avoid a possible $50 charge to your account. Please report to [address], [address] for your test.  If you have questions or concerns about your appointment, you can call the Nuclear Lab at [phone].   If you cannot keep your appointment, please provide 24 hours notification to the Nuclear Lab, to avoid a possible $50 charge to your account.    Your physician has requested that you have an echocardiogram. Echocardiography is a painless test that uses sound waves to create images of your heart. It provides your doctor with information about the size and shape of your heart and how well your heart's chambers and valves are working. This procedure takes approximately one hour. There are no restrictions for this procedure. Please do NOT wear cologne, perfume, aftershave, or lotions (deodorant is allowed). Please arrive 15 minutes prior to your appointment time.  Please note: We ask at that you not bring children with you during ultrasound (echo/ vascular) testing. Due to room size and safety concerns, children are not allowed in the ultrasound rooms during exams. Our front office staff cannot provide observation of children in our lobby area while testing is being conducted. An adult accompanying a patient to their appointment will only be allowed in the ultrasound room at the discretion of the ultrasound technician under special circumstances. We apologize for any inconvenience.  We will order CT coronary calcium score. It will cost $99.00 and is due at time of scan.  Please call to schedule.     [facility] [address] [place], [place] [address] Located on the first floor, [address] [phone]  Follow-Up: At [facility], you and your health needs are our priority.  As part of our continuing mission to provide you with exceptional heart care, we have created designated Provider Care Teams.  These Care  Teams include your primary Cardiologist (physician) and Advanced Practice Providers (APPs -  Physician Assistants and Nurse Practitioners) who all work together to provide you with the care you need, when you need it.  We recommend signing up for the patient portal called "MyChart".  Sign up information is provided on this After Visit Summary.  MyChart is used to connect with patients for Virtual Visits (Telemedicine).  Patients are able to view lab/test results, encounter notes, upcoming appointments, etc.  Non-urgent messages can be sent to your provider as well.   To learn more about what you can do with MyChart, go to [email or web address].    Your next appointment:   12 month(s)  Provider:   Belva Crome, MD   Other Instructions  Cardiac Nuclear Scan A cardiac nuclear scan is a test that is done to check the flow of blood to your heart. It is done when you are resting and when you are exercising. The test looks for problems such as: Not enough blood reaching a portion of the heart. The heart muscle not working as it should. You may need this test if you have: Heart disease. Lab results that are not normal. Had heart surgery or a balloon procedure to open up blocked arteries (angioplasty) or a small mesh tube (stent). Chest pain. Shortness of breath. Had a heart attack. In this test, a special dye (tracer) is put into your bloodstream. The tracer will travel to your heart. A camera will then take pictures of your heart to see how the tracer moves through your heart. This test is usually done at a hospital and takes 2-4 hours. Tell a doctor about: Any allergies you have. All medicines you are taking, including vitamins, herbs, eye drops, creams, and over-the-counter medicines. Any bleeding problems you have. Any surgeries you have had. Any medical conditions you have. Whether you are pregnant or may be pregnant. Any history of asthma or long-term (chronic) lung disease. Any  history of heart rhythm disorders or heart valve conditions. What are the risks? Your doctor will talk with you about risks. These may include: Serious chest pain and heart attack. This is only a risk if the stress portion of the test is done. Fast or uneven heartbeats (palpitations). A feeling of warmth in your chest. This feeling usually does not last long. Allergic reaction to the tracer. Shortness of breath or trouble breathing. What happens before the test? Ask your doctor about changing or stopping your normal medicines. Follow instructions from your doctor about what you cannot eat or drink. Remove your jewelry on the day of the test. Ask your doctor if you need to avoid nicotine or caffeine. What happens during the test? An IV tube will be inserted into one of your veins. Your doctor will give you a small amount of tracer through the IV tube. You will wait for 20-40 minutes while the tracer moves through your bloodstream. Your heart will be monitored with an electrocardiogram (ECG). You will lie down on an exam table. Pictures of your heart will be taken for about 15-20 minutes. You may also have a stress test. For this test, one of these things  may be done: You will be asked to exercise on a treadmill or a stationary bike. You will be given medicines that will make your heart work harder. This is done if you are unable to exercise. When blood flow to your heart has peaked, a tracer will again be given through the IV tube. After 20-40 minutes, you will get back on the exam table. More pictures will be taken of your heart. Depending on the tracer that is used, more pictures may need to be taken 3-4 hours later. Your IV tube will be removed when the test is over. The test may vary among doctors and hospitals. What happens after the test? Ask your doctor: Whether you can return to your normal schedule, including diet, activities, travel, and medicines. Whether you should drink  more fluids. This will help to remove the tracer from your body. Ask your doctor, or the department that is doing the test: When will my results be ready? How will I get my results? What are my treatment options? What other tests do I need? What are my next steps? This information is not intended to replace advice given to you by your health care provider. Make sure you discuss any questions you have with your health care provider. Document Revised: 09/09/2021 Document Reviewed: 09/09/2021 Elsevier Patient Education  2023 Elsevier Inc.  Echocardiogram An echocardiogram is a test that uses sound waves (ultrasound) to produce images of the heart. Images from an echocardiogram can provide important information about: Heart size and shape. The size and thickness and movement of your heart's walls. Heart muscle function and strength. Heart valve function or if you have stenosis. Stenosis is when the heart valves are too narrow. If blood is flowing backward through the heart valves (regurgitation). A tumor or infectious growth around the heart valves. Areas of heart muscle that are not working well because of poor blood flow or injury from a heart attack. Aneurysm detection. An aneurysm is a weak or damaged part of an artery wall. The wall bulges out from the normal force of blood pumping through the body. Tell a health care provider about: Any allergies you have. All medicines you are taking, including vitamins, herbs, eye drops, creams, and over-the-counter medicines. Any blood disorders you have. Any surgeries you have had. Any medical conditions you have. Whether you are pregnant or may be pregnant. What are the risks? Generally, this is a safe test. However, problems may occur, including an allergic reaction to dye (contrast) that may be used during the test. What happens before the test? No specific preparation is needed. You may eat and drink normally. What happens during the  test?  You will take off your clothes from the waist up and put on a hospital gown. Electrodes or electrocardiogram (ECG)patches may be placed on your chest. The electrodes or patches are then connected to a device that monitors your heart rate and rhythm. You will lie down on a table for an ultrasound exam. A gel will be applied to your chest to help sound waves pass through your skin. A handheld device, called a transducer, will be pressed against your chest and moved over your heart. The transducer produces sound waves that travel to your heart and bounce back (or "echo" back) to the transducer. These sound waves will be captured in real-time and changed into images of your heart that can be viewed on a video monitor. The images will be recorded on a computer and reviewed by your health  care provider. You may be asked to change positions or hold your breath for a short time. This makes it easier to get different views or better views of your heart. In some cases, you may receive contrast through an IV in one of your veins. This can improve the quality of the pictures from your heart. The procedure may vary among health care providers and hospitals. What can I expect after the test? You may return to your normal, everyday life, including diet, activities, and medicines, unless your health care provider tells you not to do that. Follow these instructions at home: It is up to you to get the results of your test. Ask your health care provider, or the department that is doing the test, when your results will be ready. Keep all follow-up visits. This is important. Summary An echocardiogram is a test that uses sound waves (ultrasound) to produce images of the heart. Images from an echocardiogram can provide important information about the size and shape of your heart, heart muscle function, heart valve function, and other possible heart problems. You do not need to do anything to prepare before this  test. You may eat and drink normally. After the echocardiogram is completed, you may return to your normal, everyday life, unless your health care provider tells you not to do that. This information is not intended to replace advice given to you by your health care provider. Make sure you discuss any questions you have with your health care provider. Document Revised: 12/25/2020 Document Reviewed: 12/05/2019 Elsevier Patient Education  2023 Elsevier Inc.    Important Information About Sugar

## 2023-04-08 NOTE — Telephone Encounter (Signed)
Spoke with the patient, detailed instructions given. Asked to call back with any questions. She stated that she would be here for her test. S.Desarae Placide CCT

## 2023-04-12 ENCOUNTER — Telehealth (HOSPITAL_COMMUNITY): Payer: Self-pay

## 2023-04-12 ENCOUNTER — Other Ambulatory Visit (HOSPITAL_BASED_OUTPATIENT_CLINIC_OR_DEPARTMENT_OTHER): Payer: Self-pay

## 2023-04-12 DIAGNOSIS — R011 Cardiac murmur, unspecified: Secondary | ICD-10-CM | POA: Diagnosis not present

## 2023-04-12 DIAGNOSIS — R079 Chest pain, unspecified: Secondary | ICD-10-CM | POA: Diagnosis not present

## 2023-04-12 NOTE — Telephone Encounter (Signed)
Spoke with the patient. Detailed instructions given. She stated that she would be here for her test. Asked to call back with any questions. S.Elnoria Livingston CCT

## 2023-04-13 ENCOUNTER — Telehealth: Payer: Self-pay

## 2023-04-13 ENCOUNTER — Ambulatory Visit (HOSPITAL_COMMUNITY): Payer: BC Managed Care – PPO | Attending: Cardiology

## 2023-04-13 DIAGNOSIS — R079 Chest pain, unspecified: Secondary | ICD-10-CM | POA: Diagnosis not present

## 2023-04-13 LAB — MYOCARDIAL PERFUSION IMAGING
LV dias vol: 59 mL (ref 46–106)
LV sys vol: 16 mL
Nuc Stress EF: 73 %
Peak HR: 98 {beats}/min
Rest HR: 66 {beats}/min
Rest Nuclear Isotope Dose: 10.8 mCi
SDS: 0
SRS: 0
SSS: 0
ST Depression (mm): 0 mm
Stress Nuclear Isotope Dose: 31.2 mCi
TID: 0.86

## 2023-04-13 MED ORDER — REGADENOSON 0.4 MG/5ML IV SOLN
0.4000 mg | Freq: Once | INTRAVENOUS | Status: AC
  Administered 2023-04-13: 0.4 mg via INTRAVENOUS

## 2023-04-13 MED ORDER — TECHNETIUM TC 99M TETROFOSMIN IV KIT
31.2000 | PACK | Freq: Once | INTRAVENOUS | Status: AC | PRN
  Administered 2023-04-13: 31.2 via INTRAVENOUS

## 2023-04-13 MED ORDER — TECHNETIUM TC 99M TETROFOSMIN IV KIT
10.8000 | PACK | Freq: Once | INTRAVENOUS | Status: AC | PRN
  Administered 2023-04-13: 10.8 via INTRAVENOUS

## 2023-04-13 NOTE — Telephone Encounter (Signed)
Normal to stable echo

## 2023-04-14 ENCOUNTER — Ambulatory Visit (HOSPITAL_BASED_OUTPATIENT_CLINIC_OR_DEPARTMENT_OTHER)
Admission: RE | Admit: 2023-04-14 | Discharge: 2023-04-14 | Disposition: A | Discharge status: Home / Self Care | Payer: Self-pay | Source: Ambulatory Visit | Attending: Cardiology | Admitting: Cardiology

## 2023-04-14 DIAGNOSIS — E782 Mixed hyperlipidemia: Secondary | ICD-10-CM | POA: Insufficient documentation

## 2023-04-19 ENCOUNTER — Encounter: Payer: Self-pay | Admitting: Cardiology

## 2023-04-26 ENCOUNTER — Telehealth: Payer: Self-pay

## 2023-04-26 ENCOUNTER — Encounter: Payer: Self-pay | Admitting: Medical-Surgical

## 2023-04-26 DIAGNOSIS — E782 Mixed hyperlipidemia: Secondary | ICD-10-CM

## 2023-04-26 DIAGNOSIS — R931 Abnormal findings on diagnostic imaging of heart and coronary circulation: Secondary | ICD-10-CM

## 2023-04-26 NOTE — Telephone Encounter (Signed)
-----   Message from Aundra Dubin Revankar sent at 04/24/2023  2:23 PM EST ----- Markedly elevated calcium score.  Please bring patient in for Chem-7 liver lipid panel.  Copy primary he will need statin therapy Garwin Brothers, MD 04/24/2023 2:22 PM

## 2023-04-26 NOTE — Telephone Encounter (Signed)
MyChart message

## 2023-04-28 LAB — COMPREHENSIVE METABOLIC PANEL
ALT: 15 [IU]/L (ref 0–32)
AST: 14 [IU]/L (ref 0–40)
Albumin: 4.2 g/dL (ref 3.9–4.9)
Alkaline Phosphatase: 73 [IU]/L (ref 44–121)
BUN/Creatinine Ratio: 18 (ref 12–28)
BUN: 14 mg/dL (ref 8–27)
Bilirubin Total: 0.4 mg/dL (ref 0.0–1.2)
CO2: 27 mmol/L (ref 20–29)
Calcium: 9.6 mg/dL (ref 8.7–10.3)
Chloride: 99 mmol/L (ref 96–106)
Creatinine, Ser: 0.78 mg/dL (ref 0.57–1.00)
Globulin, Total: 2.2 g/dL (ref 1.5–4.5)
Glucose: 97 mg/dL (ref 70–99)
Potassium: 3.3 mmol/L — ABNORMAL LOW (ref 3.5–5.2)
Sodium: 141 mmol/L (ref 134–144)
Total Protein: 6.4 g/dL (ref 6.0–8.5)
eGFR: 86 mL/min/{1.73_m2} (ref >59)

## 2023-04-28 LAB — LIPID PANEL
Chol/HDL Ratio: 5.2 {ratio} — ABNORMAL HIGH (ref 0.0–4.4)
Cholesterol, Total: 233 mg/dL — ABNORMAL HIGH (ref 100–199)
HDL: 45 mg/dL (ref >39)
LDL Chol Calc (NIH): 132 mg/dL — ABNORMAL HIGH (ref 0–99)
Triglycerides: 315 mg/dL — ABNORMAL HIGH (ref 0–149)
VLDL Cholesterol Cal: 56 mg/dL — ABNORMAL HIGH (ref 5–40)

## 2023-05-06 ENCOUNTER — Encounter: Payer: Self-pay | Admitting: Cardiology

## 2023-05-06 ENCOUNTER — Other Ambulatory Visit (HOSPITAL_BASED_OUTPATIENT_CLINIC_OR_DEPARTMENT_OTHER): Payer: Self-pay

## 2023-05-06 DIAGNOSIS — E782 Mixed hyperlipidemia: Secondary | ICD-10-CM

## 2023-05-06 DIAGNOSIS — R931 Abnormal findings on diagnostic imaging of heart and coronary circulation: Secondary | ICD-10-CM

## 2023-05-06 MED ORDER — ROSUVASTATIN CALCIUM 10 MG PO TABS
10.0000 mg | ORAL_TABLET | Freq: Every day | ORAL | 3 refills | Status: DC
  Filled 2023-05-06: qty 90, 90d supply, fill #0

## 2023-05-10 ENCOUNTER — Other Ambulatory Visit (HOSPITAL_BASED_OUTPATIENT_CLINIC_OR_DEPARTMENT_OTHER): Payer: Self-pay

## 2023-05-10 MED ORDER — ROSUVASTATIN CALCIUM 10 MG PO TABS
10.0000 mg | ORAL_TABLET | Freq: Every day | ORAL | 3 refills | Status: AC
Start: 2023-05-10 — End: 2024-05-04

## 2023-05-10 NOTE — Addendum Note (Signed)
 Addended by: Eleonore Chiquito on: 05/10/2023 09:51 AM   Modules accepted: Orders

## 2023-06-10 ENCOUNTER — Other Ambulatory Visit (HOSPITAL_BASED_OUTPATIENT_CLINIC_OR_DEPARTMENT_OTHER): Payer: Self-pay

## 2023-06-11 ENCOUNTER — Encounter: Payer: Self-pay | Admitting: Cardiology

## 2023-06-14 ENCOUNTER — Encounter (INDEPENDENT_AMBULATORY_CARE_PROVIDER_SITE_OTHER): Payer: Self-pay | Admitting: Sports Medicine

## 2023-06-14 DIAGNOSIS — M51362 Other intervertebral disc degeneration, lumbar region with discogenic back pain and lower extremity pain: Secondary | ICD-10-CM | POA: Diagnosis not present

## 2023-06-14 NOTE — Telephone Encounter (Signed)

## 2023-06-21 ENCOUNTER — Encounter: Payer: Self-pay | Admitting: Sports Medicine

## 2023-06-22 ENCOUNTER — Other Ambulatory Visit (HOSPITAL_BASED_OUTPATIENT_CLINIC_OR_DEPARTMENT_OTHER): Payer: Self-pay

## 2023-06-22 NOTE — Discharge Instructions (Signed)

## 2023-06-23 ENCOUNTER — Ambulatory Visit
Admission: RE | Admit: 2023-06-23 | Discharge: 2023-06-23 | Disposition: A | Discharge status: Home / Self Care | Payer: BC Managed Care – PPO | Source: Ambulatory Visit | Attending: Sports Medicine | Admitting: Sports Medicine

## 2023-06-23 DIAGNOSIS — M51362 Other intervertebral disc degeneration, lumbar region with discogenic back pain and lower extremity pain: Secondary | ICD-10-CM

## 2023-06-23 MED ORDER — METHYLPREDNISOLONE ACETATE 40 MG/ML INJ SUSP (RADIOLOG
80.0000 mg | Freq: Once | INTRAMUSCULAR | Status: AC
  Administered 2023-06-23: 80 mg via EPIDURAL

## 2023-06-23 MED ORDER — IOPAMIDOL (ISOVUE-M 200) INJECTION 41%
1.0000 mL | Freq: Once | INTRAMUSCULAR | Status: AC
Start: 2023-06-23 — End: 2023-06-23
  Administered 2023-06-23: 1 mL via EPIDURAL

## 2023-08-17 ENCOUNTER — Ambulatory Visit: Payer: BC Managed Care – PPO | Admitting: Medical-Surgical

## 2023-08-17 ENCOUNTER — Encounter: Payer: Self-pay | Admitting: Medical-Surgical

## 2023-08-17 ENCOUNTER — Other Ambulatory Visit (HOSPITAL_BASED_OUTPATIENT_CLINIC_OR_DEPARTMENT_OTHER): Payer: Self-pay

## 2023-08-17 VITALS — BP 93/64 | HR 77 | Resp 20 | Ht 66.0 in | Wt 187.0 lb

## 2023-08-17 DIAGNOSIS — R8290 Unspecified abnormal findings in urine: Secondary | ICD-10-CM | POA: Diagnosis not present

## 2023-08-17 DIAGNOSIS — E079 Disorder of thyroid, unspecified: Secondary | ICD-10-CM | POA: Diagnosis not present

## 2023-08-17 DIAGNOSIS — L659 Nonscarring hair loss, unspecified: Secondary | ICD-10-CM

## 2023-08-17 DIAGNOSIS — Z7985 Long-term (current) use of injectable non-insulin antidiabetic drugs: Secondary | ICD-10-CM

## 2023-08-17 DIAGNOSIS — E782 Mixed hyperlipidemia: Secondary | ICD-10-CM

## 2023-08-17 DIAGNOSIS — I10 Essential (primary) hypertension: Secondary | ICD-10-CM

## 2023-08-17 DIAGNOSIS — E119 Type 2 diabetes mellitus without complications: Secondary | ICD-10-CM

## 2023-08-17 LAB — POCT URINALYSIS DIP (CLINITEK)
Bilirubin, UA: NEGATIVE
Glucose, UA: NEGATIVE mg/dL
Ketones, POC UA: NEGATIVE mg/dL
Leukocytes, UA: NEGATIVE
Nitrite, UA: NEGATIVE
POC PROTEIN,UA: NEGATIVE
Spec Grav, UA: 1.015 (ref 1.010–1.025)
Urobilinogen, UA: 0.2 U/dL
pH, UA: 6.5 (ref 5.0–8.0)

## 2023-08-17 LAB — POCT UA - MICROALBUMIN
Albumin/Creatinine Ratio, Urine, POC: <30
Creatinine, POC: 200 mg/dL
Microalbumin Ur, POC: 30 mg/L

## 2023-08-17 LAB — POCT GLYCOSYLATED HEMOGLOBIN (HGB A1C)
HbA1c, POC (controlled diabetic range): 4.8 % (ref 0.0–7.0)
Hemoglobin A1C: 4.8 % (ref 4.0–5.6)

## 2023-08-17 MED ORDER — FINASTERIDE 5 MG PO TABS
2.5000 mg | ORAL_TABLET | Freq: Every day | ORAL | 1 refills | Status: AC
  Filled 2023-08-17: qty 45, 90d supply, fill #0

## 2023-08-17 NOTE — Progress Notes (Signed)
 Established patient visit  History, exam, impression, and plan:  1. Controlled type 2 diabetes mellitus without complication, without long-term current use of insulin (HCC) (Primary) Very pleasant 63 year old female presenting today with history of type 2 diabetes that is currently well-controlled with the use of Mounjaro  15 mg weekly.  Tolerating the medication well without side effects.  Not checking sugars at home.  Travels a lot and has difficulty at times adhering to a diabetic diet.  Stays physically active as much as tolerated.  Last hemoglobin A1c 4.9%, recheck today at 4.8%.  Urine microalbumin normal.  Diabetic foot exam passed.  Reviewed that her diabetes is now well-controlled and she is doing an Artist job.  Would like for her to continue working on weight loss to a healthy weight as well as regular intentional exercise.  Continue Mounjaro  15 mg weekly. - POCT HgB A1C - POCT UA - Microalbumin - HM Diabetes Foot Exam  2. Primary hypertension She does have a history of hypertension that is currently being treated with chlorthalidone  25 mg daily.  Reports that she has a history of lower extremity edema and that the chlorthalidone  really helps.  Her blood pressure today is a bit low at 93/64 however she is currently asymptomatic.  She does occasionally monitor blood pressures at home however this is usually when she is not feeling well.  Recent office visits show blood pressure has been stable without any hypotensive episodes.  Would like for her to monitor blood pressure at home 2-3 times weekly and look out for any readings that are less than 100/60.  If her blood pressure is running low, consider taking half of the chlorthalidone  to equal 12.5 mg daily as needed.  Currently denies any concerning symptoms.  Cardiopulmonary exam is normal.  Checking labs as below.  Continue chlorthalidone  as above. - CBC with Differential/Platelet - CMP14+EGFR - Lipid panel  3. Thyroid  disease History of thyroid disease in the remote past however her thyroid levels have been very stable with no concerns.  Not on medication for management and outside of hair loss as noted below, no concerning symptoms.  After discussion, patient would okay to defer thyroid labs for now.  4. Mixed dyslipidemia History of mixed dyslipidemia in the setting of type 2 diabetes.  Patient aware of recommendation for tighter control on her cholesterol for cardiovascular protection.  She is followed by cardiology who recently recommended that she start Crestor .  She notes that she has been on Crestor  10 mg daily, tolerating well without side effects since January.  Has not had labs to check to see how she is responding to this medicine.  Plan to check lipid panel and liver function for evaluation of tolerance and response.  5. Hair loss As previously noted, she does have significant hair loss.  We did a full workup last year with nothing found that would cause her hair loss.  She has had significant weight loss and starting on Mounjaro  and reports that her daily intake is much lower than what it was previously.  Suspect this may be nutritional in nature however with the distribution of hair loss mainly in the crown in front of the head, this could also be female pattern baldness.  Discussed various options for hair loss including adequate nutrition, hydration, topical minoxidil, and oral finasteride .  She is not interested in the topical minoxidil at this time but may change her mind in the future.  She is status  post hysterectomy so is not worried about birth control.  After review of oral finasteride , she would like to give this a try.  Sending in finasteride  2.5 mg daily.  I did advise her that this will take 6 to 9 months before she sees noticeable hair growth.  Procedures performed this visit: None.  Return in about 6 months (around 02/16/2024) for DM/HTN/HLD follow  up.  __________________________________ Mariah Snook, DNP, APRN, FNP-BC Primary Care and Sports Medicine [facility] [place]

## 2023-08-18 ENCOUNTER — Encounter: Payer: Self-pay | Admitting: Medical-Surgical

## 2023-08-18 LAB — CBC WITH DIFFERENTIAL/PLATELET
Basophils Absolute: 0.1 10*3/uL (ref 0.0–0.2)
Basos: 1 %
EOS (ABSOLUTE): 0.2 10*3/uL (ref 0.0–0.4)
Eos: 2 %
Hematocrit: 39.5 % (ref 34.0–46.6)
Hemoglobin: 13.1 g/dL (ref 11.1–15.9)
Immature Grans (Abs): 0 10*3/uL (ref 0.0–0.1)
Immature Granulocytes: 0 %
Lymphocytes Absolute: 2.8 10*3/uL (ref 0.7–3.1)
Lymphs: 28 %
MCH: 28.4 pg (ref 26.6–33.0)
MCHC: 33.2 g/dL (ref 31.5–35.7)
MCV: 86 fL (ref 79–97)
Monocytes Absolute: 0.7 10*3/uL (ref 0.1–0.9)
Monocytes: 7 %
Neutrophils Absolute: 6.2 10*3/uL (ref 1.4–7.0)
Neutrophils: 62 %
Platelets: 273 10*3/uL (ref 150–450)
RBC: 4.61 x10E6/uL (ref 3.77–5.28)
RDW: 13 % (ref 11.7–15.4)
WBC: 10 10*3/uL (ref 3.4–10.8)

## 2023-08-18 LAB — CMP14+EGFR
ALT: 12 IU/L (ref 0–32)
AST: 17 IU/L (ref 0–40)
Albumin: 3.7 g/dL — ABNORMAL LOW (ref 3.9–4.9)
Alkaline Phosphatase: 59 IU/L (ref 44–121)
BUN/Creatinine Ratio: 19 (ref 12–28)
BUN: 13 mg/dL (ref 8–27)
Bilirubin Total: 0.7 mg/dL (ref 0.0–1.2)
CO2: 26 mmol/L (ref 20–29)
Calcium: 9.1 mg/dL (ref 8.7–10.3)
Chloride: 101 mmol/L (ref 96–106)
Creatinine, Ser: 0.69 mg/dL (ref 0.57–1.00)
Globulin, Total: 2.1 g/dL (ref 1.5–4.5)
Glucose: 74 mg/dL (ref 70–99)
Potassium: 3.8 mmol/L (ref 3.5–5.2)
Sodium: 138 mmol/L (ref 134–144)
Total Protein: 5.8 g/dL — ABNORMAL LOW (ref 6.0–8.5)
eGFR: 98 mL/min/{1.73_m2} (ref >59)

## 2023-08-18 LAB — LIPID PANEL
Chol/HDL Ratio: 3.9 ratio (ref 0.0–4.4)
Cholesterol, Total: 154 mg/dL (ref 100–199)
HDL: 40 mg/dL (ref >39)
LDL Chol Calc (NIH): 82 mg/dL (ref 0–99)
Triglycerides: 186 mg/dL — ABNORMAL HIGH (ref 0–149)
VLDL Cholesterol Cal: 32 mg/dL (ref 5–40)

## 2023-08-19 LAB — URINE CULTURE

## 2023-09-06 ENCOUNTER — Encounter: Payer: Self-pay | Admitting: Sports Medicine

## 2023-09-06 ENCOUNTER — Telehealth: Payer: Self-pay | Admitting: Sports Medicine

## 2023-09-06 NOTE — Telephone Encounter (Signed)
 Submitted for benefits investigation through Orthovisc and notified patient.

## 2023-09-06 NOTE — Telephone Encounter (Signed)
 Visco approval please, left knee, failed medications, over 6 weeks PT, steroid injections, xray confirmed.

## 2023-09-07 NOTE — Telephone Encounter (Signed)
 Benefits Investigation Details received from MyVisco Injection: Orthovisc PA required: Yes May fill through: Buy and Bill  OV Copay/Coinsurance: $50 Product Copay: 20% Administration Coinsurance: 20% Administration Copay: $ Deductible: $850 (Met: $850) Individual  $1700 (Met: $1440.18) Family Out Of Pocket:$2250 (Met: $996.52) Individual  $4500 (Met: $2142.18) Family  Deductible applies. Since deductible has been met, patient is responsible for a coinsurance. Once OOP has been met, patient is covered at 100%.  Prior Authorization is required for the drug through [place].

## 2023-09-09 ENCOUNTER — Telehealth: Payer: Self-pay

## 2023-09-09 NOTE — Telephone Encounter (Signed)
 Copied from CRM [id number]. Topic: Clinical - Prescription Issue >> Sep 09, 2023  8:28 AM Blair Bumpers wrote: Reason for CRM: Pattie Borders, with [facility] called to leave a message for Dr. Elva Hamburger. She states that the patient stated an order was sent to them for Orthovisc. Pattie Borders stated they do not have any new orders for the Orthovisc & the last one they have was from September 2024-March of 2025. She stated left a number to call & get the order started with [employer]. The number is [phone]. She also wanted to know if the clinic uses a third party for this as well. Pattie Borders stated she would reach out to patient to let her know that the message would be passed to nurse/Dr. T & to possibly call for an update on Monday.

## 2023-09-10 ENCOUNTER — Ambulatory Visit (INDEPENDENT_AMBULATORY_CARE_PROVIDER_SITE_OTHER)

## 2023-09-10 ENCOUNTER — Ambulatory Visit: Admitting: Sports Medicine

## 2023-09-10 ENCOUNTER — Ambulatory Visit

## 2023-09-10 ENCOUNTER — Encounter: Payer: Self-pay | Admitting: Sports Medicine

## 2023-09-10 ENCOUNTER — Telehealth: Payer: Self-pay

## 2023-09-10 DIAGNOSIS — M170 Bilateral primary osteoarthritis of knee: Secondary | ICD-10-CM

## 2023-09-10 DIAGNOSIS — M79674 Pain in right toe(s): Secondary | ICD-10-CM | POA: Diagnosis not present

## 2023-09-10 DIAGNOSIS — S99921A Unspecified injury of right foot, initial encounter: Secondary | ICD-10-CM | POA: Diagnosis not present

## 2023-09-10 DIAGNOSIS — W208XXA Other cause of strike by thrown, projected or falling object, initial encounter: Secondary | ICD-10-CM

## 2023-09-10 MED ORDER — HYALURONAN 30 MG/2ML IX SOSY
15.0000 mg | PREFILLED_SYRINGE | Freq: Once | INTRA_ARTICULAR | Status: AC
  Administered 2023-09-10: 15 mg via INTRA_ARTICULAR

## 2023-09-10 NOTE — Addendum Note (Signed)
 Addended by: OLIVA-AVELLANEDA, Peniel Biel L on: 09/10/2023 03:30 PM   Modules accepted: Orders

## 2023-09-10 NOTE — Patient Instructions (Signed)
 Mariah Jones

## 2023-09-10 NOTE — Assessment & Plan Note (Signed)
 Skillet dropped on right great toe 1 week ago, bruising, exquisite tenderness proximal phalanx, x-rays to evaluate for fracture, she will get a Morton's plate with first MT ray extension and wear this in her shoes.

## 2023-09-10 NOTE — Telephone Encounter (Signed)
 Patient has been seen in office for Orthovisc Injection of the left knee today 09/10/2023. PA was approved prior to visit.

## 2023-09-10 NOTE — Telephone Encounter (Signed)
 We were instructed the PA needed to go through [place]. We submitted a PA through [employer]. After this phone call, we completed a PA via phone through [employer] speaking with [facility] Public relations account executive # [phone]). She stated that they could not verify that Dr. Elva Hamburger was participating with their plan and it would be 24-48 hours before they could make a decision on this PA. Sent pt a mychart message to make her aware and apologize for the mix up.

## 2023-09-10 NOTE — Telephone Encounter (Signed)
 Copied from CRM [id number]. Topic: Clinical - Prescription Issue >> Sep 10, 2023  1:24 PM Retta Caster wrote: Patient is calling to make sure that she will be finished with all her injections by 06/10 due to she is taking a trip on 06/12. Also she has to cancel app since the injections will not be ready for today app. Needs call back on clarification she will be finished with injections by 06/10. [phone]

## 2023-09-10 NOTE — Telephone Encounter (Signed)
 Per CRM, PA needs to go through [employer]. This was done via phone, spoke with [facility]. Tracking # [id number]. Felicia Horde stated that they were unable to confirm that Dr. Elva Hamburger was a participating provider and it would take 24-48 hours before a decision could be made about the PA. Awaiting confirmation. Msg sent to patient to keep her up-to-date on PA.

## 2023-09-10 NOTE — Telephone Encounter (Signed)
 Spoke with pt, the approval for her injections was on the fax machine when we returned from lunch. She is aware and will keep her appt this afternoon to get started. This way she will be done prior to her trip. She did state that she is aware of being responsible for a portion of the cost.

## 2023-09-10 NOTE — Progress Notes (Signed)
    Procedures performed today:    Procedure: Real-time Ultrasound Guided injection of the left knee Device: Samsung HS60  Verbal informed consent obtained.  Time-out conducted.  Noted no overlying erythema, induration, or other signs of local infection.  Skin prepped in a sterile fashion.  Local anesthesia: Topical Ethyl chloride.  With sterile technique and under real time ultrasound guidance: Trace effusion noted, 30 mg/2 mL of OrthoVisc (sodium hyaluronate) in a prefilled syringe was injected easily into the knee through a 22-gauge needle. Completed without difficulty  Advised to call if fevers/chills, erythema, induration, drainage, or persistent bleeding.  Images permanently stored and available for review in PACS.  Impression: Technically successful ultrasound guided injection.  Independent interpretation of notes and tests performed by another provider:   None.  Brief History, Exam, Impression, and Recommendations:    Primary osteoarthritis of both knees status post meniscal debridement on the left Orthovisc 1 of 4 left knee. Return in 1 week for #2 of 4.  Great toe pain, right Skillet dropped on right great toe 1 week ago, bruising, exquisite tenderness proximal phalanx, x-rays to evaluate for fracture, she will get a Morton's plate with first MT ray extension and wear this in her shoes.   ____________________________________________ Joselyn Nicely. Sandy Crumb, M.D., ABFM., CAQSM., AME. Primary Care and Sports Medicine [facility]  Adjunct Professor of [facility] Medicine  [employer]  Restaurant manager, fast food

## 2023-09-10 NOTE — Assessment & Plan Note (Signed)
 Orthovisc 1 of 4 left knee. Return in 1 week for #2 of 4.

## 2023-09-14 NOTE — Telephone Encounter (Signed)
 Fax confirming approval rec'd and sent for scanning into patient's chart. Pt aware of approval and aware of her portion of the cost of the injections. She is scheduled for an appt.

## 2023-09-17 ENCOUNTER — Ambulatory Visit: Admitting: Sports Medicine

## 2023-09-17 ENCOUNTER — Other Ambulatory Visit (INDEPENDENT_AMBULATORY_CARE_PROVIDER_SITE_OTHER)

## 2023-09-17 ENCOUNTER — Other Ambulatory Visit (HOSPITAL_BASED_OUTPATIENT_CLINIC_OR_DEPARTMENT_OTHER): Payer: Self-pay

## 2023-09-17 ENCOUNTER — Telehealth: Payer: Self-pay | Admitting: Sports Medicine

## 2023-09-17 DIAGNOSIS — M170 Bilateral primary osteoarthritis of knee: Secondary | ICD-10-CM | POA: Diagnosis not present

## 2023-09-17 MED ORDER — HYALURONAN 30 MG/2ML IX SOSY
15.0000 mg | PREFILLED_SYRINGE | Freq: Once | INTRA_ARTICULAR | Status: AC
  Administered 2023-09-17: 15 mg via INTRA_ARTICULAR

## 2023-09-17 NOTE — Assessment & Plan Note (Signed)
Orthovisc 2 of 4 left knee, return in 1 week for #3 of 4 

## 2023-09-17 NOTE — Addendum Note (Signed)
 Addended by: Montgomery Apgar on: 09/17/2023 10:25 AM   Modules accepted: Orders

## 2023-09-17 NOTE — Telephone Encounter (Addendum)
 Right Knee Orthovisc  Benefits Investigation Details received from MyVisco Injection: Orthovisc PA required: Yes May fill through: Buy and Bill  OV Copay/Coinsurance: 20% Product Copay: 20% Administration Coinsurance: 20% Administration Copay: $ Deductible: $  Benefits Investigation Started  Deductible applies. Once the deductible has been met, the patient is responsible for a coinsurance. Once the OOP has been met, patient is covered at 100%.  Prior Authorization for the drug is required and on file.  Patient has been advised.

## 2023-09-17 NOTE — Telephone Encounter (Signed)
 Visco approval por favor right knee.

## 2023-09-17 NOTE — Progress Notes (Signed)
    Procedures performed today:    Procedure: Real-time Ultrasound Guided injection of the left knee Device: Samsung HS60  Verbal informed consent obtained.  Time-out conducted.  Noted no overlying erythema, induration, or other signs of local infection.  Skin prepped in a sterile fashion.  Local anesthesia: Topical Ethyl chloride.  With sterile technique and under real time ultrasound guidance: Trace effusion noted, 30 mg/2 mL of OrthoVisc (sodium hyaluronate) in a prefilled syringe was injected easily into the knee through a 22-gauge needle. Completed without difficulty  Advised to call if fevers/chills, erythema, induration, drainage, or persistent bleeding.  Images permanently stored and available for review in PACS.  Impression: Technically successful ultrasound guided injection.  Independent interpretation of notes and tests performed by another provider:   None.  Brief History, Exam, Impression, and Recommendations:    Primary osteoarthritis of both knees status post meniscal debridement on the left Orthovisc 2 of 4 left knee, return in 1 week for #3 of 4.    ____________________________________________ Joselyn Nicely. Sandy Crumb, M.D., ABFM., CAQSM., AME. Primary Care and Sports Medicine  [facility]  Adjunct Professor of [facility] Medicine  [employer]  Restaurant manager, fast food

## 2023-09-27 ENCOUNTER — Other Ambulatory Visit (INDEPENDENT_AMBULATORY_CARE_PROVIDER_SITE_OTHER)

## 2023-09-27 ENCOUNTER — Encounter: Payer: Self-pay | Admitting: Sports Medicine

## 2023-09-27 ENCOUNTER — Ambulatory Visit: Admitting: Sports Medicine

## 2023-09-27 DIAGNOSIS — M170 Bilateral primary osteoarthritis of knee: Secondary | ICD-10-CM

## 2023-09-27 MED ORDER — HYALURONAN 30 MG/2ML IX SOSY
15.0000 mg | PREFILLED_SYRINGE | Freq: Once | INTRA_ARTICULAR | Status: AC
  Administered 2023-09-27: 15 mg via INTRA_ARTICULAR

## 2023-09-27 NOTE — Progress Notes (Signed)
    Procedures performed today:    Procedure: Real-time Ultrasound Guided injection of the left knee Device: Samsung HS60  Verbal informed consent obtained.  Time-out conducted.  Noted no overlying erythema, induration, or other signs of local infection.  Skin prepped in a sterile fashion.  Local anesthesia: Topical Ethyl chloride.  With sterile technique and under real time ultrasound guidance: Trace effusion noted, 30 mg/2 mL of OrthoVisc (sodium hyaluronate) in a prefilled syringe was injected easily into the knee through a 22-gauge needle. Completed without difficulty  Advised to call if fevers/chills, erythema, induration, drainage, or persistent bleeding.  Images permanently stored and available for review in PACS.  Impression: Technically successful ultrasound guided injection.  Independent interpretation of notes and tests performed by another provider:   None.  Brief History, Exam, Impression, and Recommendations:    Primary osteoarthritis of both knees status post meniscal debridement on the left Orthovisc 3 of 4 left knee, return in 1 week for #4 of 4.    ____________________________________________ Joselyn Nicely. Sandy Crumb, M.D., ABFM., CAQSM., AME. Primary Care and Sports Medicine [place] [facility] [facility]  Adjunct Professor of [facility] Medicine  [employer]  Restaurant manager, fast food

## 2023-09-27 NOTE — Assessment & Plan Note (Signed)
Orthovisc 3 of 4 left knee, return in 1 week for #4 of 4.

## 2023-10-04 ENCOUNTER — Other Ambulatory Visit (INDEPENDENT_AMBULATORY_CARE_PROVIDER_SITE_OTHER)

## 2023-10-04 ENCOUNTER — Ambulatory Visit: Admitting: Sports Medicine

## 2023-10-04 DIAGNOSIS — M170 Bilateral primary osteoarthritis of knee: Secondary | ICD-10-CM | POA: Diagnosis not present

## 2023-10-04 MED ORDER — HYALURONAN 30 MG/2ML IX SOSY
15.0000 mg | PREFILLED_SYRINGE | Freq: Once | INTRA_ARTICULAR | Status: AC
  Administered 2023-10-04: 15 mg via INTRA_ARTICULAR

## 2023-10-04 NOTE — Assessment & Plan Note (Signed)
 Orthovisc 4 of 4 left knee, return in 6 weeks as needed.

## 2023-10-04 NOTE — Addendum Note (Signed)
 Addended by: Burton Casey L on: 10/04/2023 10:01 AM   Modules accepted: Orders

## 2023-10-04 NOTE — Progress Notes (Signed)
    Procedures performed today:    Procedure: Real-time Ultrasound Guided injection of the left knee Device: Samsung HS60  Verbal informed consent obtained.  Time-out conducted.  Noted no overlying erythema, induration, or other signs of local infection.  Skin prepped in a sterile fashion.  Local anesthesia: Topical Ethyl chloride.  With sterile technique and under real time ultrasound guidance: Trace effusion noted, 30 mg/2 mL of OrthoVisc (sodium hyaluronate) in a prefilled syringe was injected easily into the knee through a 22-gauge needle. Completed without difficulty  Advised to call if fevers/chills, erythema, induration, drainage, or persistent bleeding.  Images permanently stored and available for review in PACS.  Impression: Technically successful ultrasound guided injection.  Independent interpretation of notes and tests performed by another provider:   None.  Brief History, Exam, Impression, and Recommendations:    Primary osteoarthritis of both knees status post meniscal debridement on the left Orthovisc 4 of 4 left knee, return in 6 weeks as needed.    ____________________________________________ Joselyn Nicely. Sandy Crumb, M.D., ABFM., CAQSM., AME. Primary Care and Sports Medicine [place] [facility]  Adjunct Professor of [facility] Medicine  [employer]  Restaurant manager, fast food

## 2023-11-08 ENCOUNTER — Other Ambulatory Visit (HOSPITAL_BASED_OUTPATIENT_CLINIC_OR_DEPARTMENT_OTHER): Payer: Self-pay

## 2023-11-08 ENCOUNTER — Ambulatory Visit

## 2023-11-08 ENCOUNTER — Other Ambulatory Visit: Payer: Self-pay | Admitting: Medical-Surgical

## 2023-11-08 ENCOUNTER — Other Ambulatory Visit (INDEPENDENT_AMBULATORY_CARE_PROVIDER_SITE_OTHER)

## 2023-11-08 ENCOUNTER — Ambulatory Visit: Admitting: Sports Medicine

## 2023-11-08 DIAGNOSIS — M25571 Pain in right ankle and joints of right foot: Secondary | ICD-10-CM | POA: Diagnosis not present

## 2023-11-08 DIAGNOSIS — G8929 Other chronic pain: Secondary | ICD-10-CM

## 2023-11-08 DIAGNOSIS — M170 Bilateral primary osteoarthritis of knee: Secondary | ICD-10-CM

## 2023-11-08 DIAGNOSIS — Z1231 Encounter for screening mammogram for malignant neoplasm of breast: Secondary | ICD-10-CM

## 2023-11-08 MED ORDER — HYALURONAN 30 MG/2ML IX SOSY
15.0000 mg | PREFILLED_SYRINGE | Freq: Once | INTRA_ARTICULAR | Status: AC
  Administered 2023-11-08: 15 mg via INTRA_ARTICULAR

## 2023-11-08 NOTE — Addendum Note (Signed)
 Addended by: OLIVA-AVELLANEDA, Izzak Fries L on: 11/08/2023 09:53 AM   Modules accepted: Orders

## 2023-11-08 NOTE — Assessment & Plan Note (Signed)
 Chronic pain right ankle lateral aspect she does have some swelling, we will add x-rays and an MRI, this will help to guide treatment.

## 2023-11-08 NOTE — Assessment & Plan Note (Signed)
 We finished Orthovisc left knee back in June. Today we did Orthovisc No. 1 of 4 right knee, return in 1 week for #2 of 4 right knee.

## 2023-11-08 NOTE — Progress Notes (Signed)
    Procedures performed today:    Procedure: Real-time Ultrasound Guided injection of the right knee Device: Samsung HS60  Verbal informed consent obtained.  Time-out conducted.  Noted no overlying erythema, induration, or other signs of local infection.  Skin prepped in a sterile fashion.  Local anesthesia: Topical Ethyl chloride.  With sterile technique and under real time ultrasound guidance: Trace effusion noted, 30 mg/2 mL of OrthoVisc (sodium hyaluronate) in a prefilled syringe was injected easily into the knee through a 22-gauge needle. Completed without difficulty  Advised to call if fevers/chills, erythema, induration, drainage, or persistent bleeding.  Images permanently stored and available for review in PACS.  Impression: Technically successful ultrasound guided injection.  Independent interpretation of notes and tests performed by another provider:   None.  Brief History, Exam, Impression, and Recommendations:    Primary osteoarthritis of both knees status post meniscal debridement on the left We finished Orthovisc left knee back in June. Today we did Orthovisc No. 1 of 4 right knee, return in 1 week for #2 of 4 right knee.  Right ankle pain Chronic pain right ankle lateral aspect she does have some swelling, we will add x-rays and an MRI, this will help to guide treatment.    ____________________________________________ Debby PARAS. Curtis, M.D., ABFM., CAQSM., AME. Primary Care and Sports Medicine [place] [facility]  Adjunct Professor of [facility] Medicine  [employer]  Restaurant manager, fast food

## 2023-11-09 ENCOUNTER — Ambulatory Visit: Payer: Self-pay | Admitting: Sports Medicine

## 2023-11-14 ENCOUNTER — Ambulatory Visit

## 2023-11-14 DIAGNOSIS — M25571 Pain in right ankle and joints of right foot: Secondary | ICD-10-CM | POA: Diagnosis not present

## 2023-11-14 DIAGNOSIS — G8929 Other chronic pain: Secondary | ICD-10-CM | POA: Diagnosis not present

## 2023-11-15 ENCOUNTER — Other Ambulatory Visit (INDEPENDENT_AMBULATORY_CARE_PROVIDER_SITE_OTHER)

## 2023-11-15 ENCOUNTER — Ambulatory Visit: Admitting: Sports Medicine

## 2023-11-15 DIAGNOSIS — M170 Bilateral primary osteoarthritis of knee: Secondary | ICD-10-CM

## 2023-11-15 MED ORDER — HYALURONAN 30 MG/2ML IX SOSY
15.0000 mg | PREFILLED_SYRINGE | Freq: Once | INTRA_ARTICULAR | Status: AC
  Administered 2023-11-15: 15 mg via INTRA_ARTICULAR

## 2023-11-15 NOTE — Assessment & Plan Note (Addendum)
 We finished Orthovisc left knee back in June, today we did Orthovisc 2 of 4 right knee, return in 1 week for #3 of 4.

## 2023-11-15 NOTE — Addendum Note (Signed)
 Addended by: OLIVA-AVELLANEDA, Neira Bentsen L on: 11/15/2023 09:36 AM   Modules accepted: Orders

## 2023-11-15 NOTE — Progress Notes (Signed)
    Procedures performed today:    Procedure: Real-time Ultrasound Guided injection of the right knee Device: Samsung HS60  Verbal informed consent obtained.  Time-out conducted.  Noted no overlying erythema, induration, or other signs of local infection.  Skin prepped in a sterile fashion.  Local anesthesia: Topical Ethyl chloride.  With sterile technique and under real time ultrasound guidance: No effusion noted, 30 mg/2 mL of OrthoVisc (sodium hyaluronate) in a prefilled syringe was injected easily into the knee through a 22-gauge needle. Completed without difficulty  Advised to call if fevers/chills, erythema, induration, drainage, or persistent bleeding.  Images permanently stored and available for review in PACS.  Impression: Technically successful ultrasound guided injection.  Independent interpretation of notes and tests performed by another provider:   None.  Brief History, Exam, Impression, and Recommendations:    Primary osteoarthritis of both knees status post meniscal debridement on the left We finished Orthovisc left knee back in June, today we did Orthovisc 2 of 4 right knee, return in 1 week for #3 of 4.    ____________________________________________ Debby PARAS. Curtis, M.D., ABFM., CAQSM., AME. Primary Care and Sports Medicine [facility]  Adjunct Professor of [facility] Medicine  [employer]  Restaurant manager, fast food

## 2023-11-22 ENCOUNTER — Ambulatory Visit: Admitting: Sports Medicine

## 2023-11-25 ENCOUNTER — Ambulatory Visit: Admitting: Sports Medicine

## 2023-11-25 ENCOUNTER — Encounter: Payer: Self-pay | Admitting: Sports Medicine

## 2023-11-25 ENCOUNTER — Other Ambulatory Visit (INDEPENDENT_AMBULATORY_CARE_PROVIDER_SITE_OTHER)

## 2023-11-25 DIAGNOSIS — M170 Bilateral primary osteoarthritis of knee: Secondary | ICD-10-CM

## 2023-11-25 MED ORDER — HYALURONAN 30 MG/2ML IX SOSY
15.0000 mg | PREFILLED_SYRINGE | Freq: Once | INTRA_ARTICULAR | Status: AC
  Administered 2023-11-25: 15 mg via INTRA_ARTICULAR

## 2023-11-25 NOTE — Progress Notes (Signed)
    Procedures performed today:    Procedure: Real-time Ultrasound Guided injection of the right knee Device: Samsung HS60  Verbal informed consent obtained.  Time-out conducted.  Noted no overlying erythema, induration, or other signs of local infection.  Skin prepped in a sterile fashion.  Local anesthesia: Topical Ethyl chloride.  With sterile technique and under real time ultrasound guidance: No effusion noted, 30 mg/2 mL of OrthoVisc (sodium hyaluronate) in a prefilled syringe was injected easily into the knee through a 22-gauge needle. Completed without difficulty  Advised to call if fevers/chills, erythema, induration, drainage, or persistent bleeding.  Images permanently stored and available for review in PACS.  Impression: Technically successful ultrasound guided injection.  Independent interpretation of notes and tests performed by another provider:   None.  Brief History, Exam, Impression, and Recommendations:    Primary osteoarthritis of both knees status post meniscal debridement on the left Orthovisc left knee was finished back in June, today we did Orthovisc 3 of 4 right knee. Return in 1 week for #4 of 4    ____________________________________________ Debby PARAS. Curtis, M.D., ABFM., CAQSM., AME. Primary Care and Sports Medicine [facility] [facility]  Adjunct Professor of [facility] Medicine  [employer]  Restaurant manager, fast food

## 2023-11-25 NOTE — Assessment & Plan Note (Addendum)
 Orthovisc left knee was finished back in June, today we did Orthovisc 3 of 4 right knee. Return in 1 week for #4 of 4

## 2023-11-29 ENCOUNTER — Ambulatory Visit: Admitting: Sports Medicine

## 2023-12-02 ENCOUNTER — Other Ambulatory Visit (INDEPENDENT_AMBULATORY_CARE_PROVIDER_SITE_OTHER)

## 2023-12-02 ENCOUNTER — Ambulatory Visit: Admitting: Sports Medicine

## 2023-12-02 DIAGNOSIS — M170 Bilateral primary osteoarthritis of knee: Secondary | ICD-10-CM

## 2023-12-02 MED ORDER — HYALURONAN 30 MG/2ML IX SOSY
15.0000 mg | PREFILLED_SYRINGE | Freq: Once | INTRA_ARTICULAR | Status: AC
  Administered 2023-12-02: 15 mg via INTRA_ARTICULAR

## 2023-12-02 MED ORDER — TRIAMCINOLONE ACETONIDE 40 MG/ML IJ SUSP
40.0000 mg | Freq: Once | INTRAMUSCULAR | Status: AC
  Administered 2023-12-02: 40 mg via INTRA_ARTICULAR

## 2023-12-02 NOTE — Patient Instructions (Signed)
 Platelet-rich plasma is used in musculoskeletal medicine to focus your own body's ability to heal. It has several well-done published randomized control trials (RCT) which demonstrate both its effectiveness and safety in many musculoskeletal conditions, including osteoarthritis, tendinopathies, and damaged vertebral discs. PRP has been in clinical use since the 1990's. Many people know that platelets form a clot if there is a cut in the skin. It turns out that platelets do not only form a clot, they also start the body's own repair process. When platelets activate to form a clot, they also release alpha granules which have hundreds of chemical messengers in them that initiate and organize repair to the damaged tissue. Precisely placing PRP into the site of injury will initiate the healing process by activating on the damaged cartilage or tendon. This is an inflammatory process, and inflammation is the vital first phase of Healing.  What to expect and how to prepare for PRP   2 weeks prior to the procedure: depending on the procedure, you may need to arrange for a driver to bring you home. IF you are having a lower extremity procedure, we can provide crutches as needed.   7 days prior to the procedure: Stop taking anti-inflammatory drugs like ibuprofen, Naprosyn, Celebrex, or Meloxicam. Let Dr. Benjamin Stain know if you have been taking prednisone or other corticosteroids in the last month.   The day before the procedure: thoroughly shower and clean your skin.    The day of the procedure: Wear loose-fitting clothing like sweatpants or shorts. If you are having an upper body procedure wear a top that can button or zip up.  PRP will initiate healing and a productive inflammation, and PRP therapy will make the body part treated sore for 4 days to two weeks. Anti-inflammatory drugs (i.e. ibuprofen, Naprosyn, Celebrex) and corticosteroids such as prednisone can blunt or stop this process,  so it is important to not take any anti-inflammatory drugs for 7 days before getting PRP therapy, or for at least three weeks after PRP therapy. Corticosteroid injections can blunt inflammation for 30 days, so let us know if you have had one recently. Depending on the body part injected, you may be in a sling or on crutches for several days. Just like wringing out a wet dishcloth, if you load or tense a tendon or ligament that has just been injected with PRP, some of the PRP injected will squish out. By keeping the body part treated relaxed by using a sling (for the shoulder or arm) or crutches (for hips and legs) for a few days, the PRP can bind in place and do its job.   You may need a driver to bring you home.  Tobacco/nicotine is a potent toxin and its use constricts small blood vessels which are needed for tissue repair.  Tobacco/nicotine use will limit the effectiveness of any treatment and stopping tobacco use is one of the single  greatest actions you can take to improve your health. Avoid toxins like alcohol, which inhibits and depresses the cells needed for tissue repair.  What happens during the PRP procedure?  Platelet rich plasma is made by taking some of your blood and performing a two-stage centrifuge process on it to concentrate the PRP. First, your blood is drawn into a syringe with a small amount of anti-coagulant in it (this is to keep the blood from clotting during this process). The amount of blood drawn is usually about 10-30 milliliters, depending on how much PRP is needed for  the treatment.  (There are 355 milliliters in a 12-ounce soda can for comparison).  Then the blood is transferred in a sterile fashion into a centrifuge tube. It is then centrifuged for the first cycle where the red blood cells are isolated and discarded. In the second centrifuge cycle, the platelet-rich fraction of the remaining plasma is concentrated and placed in a syringe. The skin at the  injection site is numbed with a small amount of topical cooling spray. Dr Benjamin Stain will then precisely inject the PRP into the injury site using ultrasound guidance.  What to do after your procedure  I will give you specific medicine to control any discomfort you may have after the procedure. Avoid NSAIDs like ibuprofen. Acetaminophen can be used for mild pain.  Depending on the part of the body treated, usually you will be placed in a sling or on crutches for 1 to 3 days. Do your best not to tense or load the treated area during this time. After 3 days, unless otherwise instructed, the treated body part should be used and slowly moved through its full range of motion. It will be sore, but you will not be doing damage by moving it, in fact it needs to move to heal. If you were on crutches for a period of time, walking is ok once you are off the crutches. For now, avoid activities that specifically hurt you before being treated. Exercise is vital to good health and finding a way to cross train around your injury is important not only for your physical health, but for your mental health as well. Ask me about cross training options for your injury. Some brief (10 minutes or less) period of heat or ice therapy will not hurt the therapy, but it is not required. Usually, depending on the initial injury, physical therapy is started from two weeks to four weeks after injection. Improvements in pain and function should be expected from 8 weeks to 12 weeks after injection and some injuries may require more than one treatment.    ____________________________________________ Mariah Jones. Benjamin Stain, M.D., ABFM., CAQSM., AME. Primary Care and Sports Medicine [facility]  Adjunct Professor of Family Medicine  [employer]  Restaurant manager, fast food

## 2023-12-02 NOTE — Assessment & Plan Note (Addendum)
 Orthovisc left knee finished back in June we did a steroid injection left knee today, today we did Orthovisc 4 of 4 right knee, return in 6 weeks as needed.

## 2023-12-02 NOTE — Progress Notes (Signed)
    Procedures performed today:    Procedure: Real-time Ultrasound Guided injection of the right knee Device: Samsung HS60  Verbal informed consent obtained.  Time-out conducted.  Noted no overlying erythema, induration, or other signs of local infection.  Skin prepped in a sterile fashion.  Local anesthesia: Topical Ethyl chloride.  With sterile technique and under real time ultrasound guidance: No effusion noted, 30 mg/2 mL of OrthoVisc (sodium hyaluronate) in a prefilled syringe was injected easily into the knee through a 22-gauge needle. Completed without difficulty  Advised to call if fevers/chills, erythema, induration, drainage, or persistent bleeding.  Images permanently stored and available for review in PACS.  Impression: Technically successful ultrasound guided injection.  Procedure: Real-time Ultrasound Guided injection of the left knee Device: Samsung HS60  Verbal informed consent obtained.  Time-out conducted.  Noted no overlying erythema, induration, or other signs of local infection.  Skin prepped in a sterile fashion.  Local anesthesia: Topical Ethyl chloride.  With sterile technique and under real time ultrasound guidance: Trace effusion noted, 1 cc Kenalog  40, 2 cc lidocaine, 2 cc bupivacaine injected easily Completed without difficulty  Advised to call if fevers/chills, erythema, induration, drainage, or persistent bleeding.  Images permanently stored and available for review in PACS.  Impression: Technically successful ultrasound guided injection.  Independent interpretation of notes and tests performed by another provider:   None.  Brief History, Exam, Impression, and Recommendations:    Primary osteoarthritis of both knees status post meniscal debridement on the left Orthovisc left knee finished back in June we did a steroid injection left knee today, today we did Orthovisc 4 of 4 right knee, return in 6 weeks as  needed.    ____________________________________________ Debby PARAS. Curtis, M.D., ABFM., CAQSM., AME. Primary Care and Sports Medicine [facility]  Adjunct Professor of [facility] [facility] [facility]  [employer]  Restaurant manager, fast food

## 2023-12-02 NOTE — Addendum Note (Signed)
 Addended by: OLIVA-AVELLANEDA, Torrin Frein L on: 12/02/2023 10:35 AM   Modules accepted: Orders

## 2023-12-08 ENCOUNTER — Other Ambulatory Visit: Payer: Self-pay | Admitting: Medical Genetics

## 2023-12-15 ENCOUNTER — Ambulatory Visit

## 2023-12-15 DIAGNOSIS — Z1231 Encounter for screening mammogram for malignant neoplasm of breast: Secondary | ICD-10-CM

## 2023-12-17 ENCOUNTER — Ambulatory Visit: Payer: Self-pay | Admitting: Medical-Surgical

## 2023-12-20 ENCOUNTER — Encounter: Payer: Self-pay | Admitting: Medical-Surgical

## 2023-12-28 ENCOUNTER — Encounter: Payer: Self-pay | Admitting: Sports Medicine

## 2024-01-18 ENCOUNTER — Other Ambulatory Visit (HOSPITAL_BASED_OUTPATIENT_CLINIC_OR_DEPARTMENT_OTHER): Payer: Self-pay

## 2024-01-18 ENCOUNTER — Other Ambulatory Visit: Payer: Self-pay | Admitting: Medical-Surgical

## 2024-01-18 DIAGNOSIS — E119 Type 2 diabetes mellitus without complications: Secondary | ICD-10-CM

## 2024-01-18 MED ORDER — MOUNJARO 15 MG/0.5ML ~~LOC~~ SOAJ
15.0000 mg | SUBCUTANEOUS | 0 refills | Status: DC
  Filled 2024-01-18: qty 2, 28d supply, fill #0

## 2024-02-18 ENCOUNTER — Other Ambulatory Visit: Payer: Self-pay | Admitting: Medical Genetics

## 2024-02-18 DIAGNOSIS — Z006 Encounter for examination for normal comparison and control in clinical research program: Secondary | ICD-10-CM

## 2024-02-19 ENCOUNTER — Other Ambulatory Visit: Payer: Self-pay | Admitting: Medical-Surgical

## 2024-02-26 ENCOUNTER — Other Ambulatory Visit: Payer: Self-pay | Admitting: Medical-Surgical

## 2024-02-26 DIAGNOSIS — E119 Type 2 diabetes mellitus without complications: Secondary | ICD-10-CM

## 2024-03-02 ENCOUNTER — Other Ambulatory Visit (HOSPITAL_BASED_OUTPATIENT_CLINIC_OR_DEPARTMENT_OTHER): Payer: Self-pay

## 2024-03-02 MED ORDER — MOUNJARO 15 MG/0.5ML ~~LOC~~ SOAJ
15.0000 mg | SUBCUTANEOUS | 0 refills | Status: DC
  Filled 2024-03-02: qty 2, 28d supply, fill #0

## 2024-03-21 ENCOUNTER — Telehealth: Payer: Self-pay | Admitting: Medical-Surgical

## 2024-03-21 NOTE — Telephone Encounter (Signed)
 Copied from CRM [id number]. Topic: Medical Record Request - Provider/Facility Request >> Mar 21, 2024  9:58 AM Rosaria BRAVO wrote: Reason for CRM: [facility] needs records prior to her being seen. Needs all records from Dr. ONEIDA. Office visit notes, labs, etc.  They will not schedule her without these records.   Fax: [phone] Dr. Lavelle Ada orthopedic Surgeon

## 2024-03-22 ENCOUNTER — Other Ambulatory Visit: Payer: Self-pay | Admitting: Medical-Surgical

## 2024-03-22 NOTE — Telephone Encounter (Signed)
 Faxed requested records to [facility] - Dr. Lavelle Ada Orthopaedic Surgeon

## 2024-03-28 ENCOUNTER — Other Ambulatory Visit: Payer: Self-pay | Admitting: Medical-Surgical

## 2024-03-28 ENCOUNTER — Ambulatory Visit: Admitting: Sports Medicine

## 2024-03-28 ENCOUNTER — Other Ambulatory Visit (HOSPITAL_BASED_OUTPATIENT_CLINIC_OR_DEPARTMENT_OTHER): Payer: Self-pay

## 2024-03-28 DIAGNOSIS — E119 Type 2 diabetes mellitus without complications: Secondary | ICD-10-CM

## 2024-03-28 MED ORDER — MOUNJARO 15 MG/0.5ML ~~LOC~~ SOAJ
15.0000 mg | SUBCUTANEOUS | 0 refills | Status: DC
  Filled 2024-03-28: qty 2, 28d supply, fill #0

## 2024-04-04 ENCOUNTER — Ambulatory Visit: Admitting: Sports Medicine

## 2024-04-06 ENCOUNTER — Other Ambulatory Visit: Payer: Self-pay | Admitting: Medical-Surgical

## 2024-04-11 ENCOUNTER — Other Ambulatory Visit: Payer: Self-pay | Admitting: Medical-Surgical

## 2024-04-11 ENCOUNTER — Ambulatory Visit: Admitting: Sports Medicine

## 2024-04-18 ENCOUNTER — Ambulatory Visit: Admitting: Sports Medicine

## 2024-04-23 ENCOUNTER — Other Ambulatory Visit: Payer: Self-pay | Admitting: Medical-Surgical

## 2024-04-23 DIAGNOSIS — E119 Type 2 diabetes mellitus without complications: Secondary | ICD-10-CM

## 2024-04-24 ENCOUNTER — Other Ambulatory Visit (HOSPITAL_BASED_OUTPATIENT_CLINIC_OR_DEPARTMENT_OTHER): Payer: Self-pay

## 2024-04-24 MED ORDER — MOUNJARO 15 MG/0.5ML ~~LOC~~ SOAJ
15.0000 mg | SUBCUTANEOUS | 0 refills | Status: DC
  Filled 2024-04-24: qty 2, 28d supply, fill #0

## 2024-04-28 LAB — GENECONNECT MOLECULAR SCREEN: Genetic Analysis Overall Interpretation: NEGATIVE

## 2024-05-17 ENCOUNTER — Other Ambulatory Visit: Payer: Self-pay | Admitting: Medical-Surgical

## 2024-05-17 ENCOUNTER — Other Ambulatory Visit (HOSPITAL_BASED_OUTPATIENT_CLINIC_OR_DEPARTMENT_OTHER): Payer: Self-pay

## 2024-05-17 DIAGNOSIS — E119 Type 2 diabetes mellitus without complications: Secondary | ICD-10-CM

## 2024-05-17 MED ORDER — MOUNJARO 15 MG/0.5ML ~~LOC~~ SOAJ
15.0000 mg | SUBCUTANEOUS | 0 refills | Status: AC
  Filled 2024-05-17: qty 2, 28d supply, fill #0
